# Patient Record
Sex: Male | Born: 1975 | Race: White | Hispanic: No | Marital: Married | Smoking: Never smoker
Health system: Southern US, Community
[De-identification: ages and names within clinical notes are randomized; demographics above are authoritative.]

## PROBLEM LIST (undated history)

## (undated) DIAGNOSIS — F329 Major depressive disorder, single episode, unspecified: Secondary | ICD-10-CM

## (undated) DIAGNOSIS — F32A Depression, unspecified: Secondary | ICD-10-CM

## (undated) DIAGNOSIS — E119 Type 2 diabetes mellitus without complications: Secondary | ICD-10-CM

## (undated) DIAGNOSIS — U070 Vaping-related disorder: Secondary | ICD-10-CM

## (undated) DIAGNOSIS — F419 Anxiety disorder, unspecified: Secondary | ICD-10-CM

## (undated) HISTORY — DX: Anxiety disorder, unspecified: F41.9

## (undated) HISTORY — PX: OTHER SURGICAL HISTORY: SHX169

## (undated) HISTORY — DX: Major depressive disorder, single episode, unspecified: F32.9

## (undated) HISTORY — DX: Depression, unspecified: F32.A

---

## 1998-06-15 ENCOUNTER — Emergency Department (HOSPITAL_COMMUNITY): Admission: EM | Admit: 1998-06-15 | Discharge: 1998-06-15 | Payer: Self-pay | Admitting: Emergency Medicine

## 2005-08-11 ENCOUNTER — Emergency Department (HOSPITAL_COMMUNITY): Admission: EM | Admit: 2005-08-11 | Discharge: 2005-08-11 | Payer: Self-pay | Admitting: Emergency Medicine

## 2005-08-22 ENCOUNTER — Emergency Department (HOSPITAL_COMMUNITY): Admission: EM | Admit: 2005-08-22 | Discharge: 2005-08-23 | Payer: Self-pay | Admitting: Emergency Medicine

## 2008-12-08 ENCOUNTER — Emergency Department (HOSPITAL_COMMUNITY): Admission: EM | Admit: 2008-12-08 | Discharge: 2008-12-08 | Payer: Self-pay | Admitting: Emergency Medicine

## 2010-06-08 ENCOUNTER — Emergency Department (HOSPITAL_COMMUNITY): Admission: EM | Admit: 2010-06-08 | Discharge: 2010-06-08 | Payer: Self-pay | Admitting: Family Medicine

## 2010-10-25 ENCOUNTER — Emergency Department (HOSPITAL_COMMUNITY): Admission: EM | Admit: 2010-10-25 | Discharge: 2010-10-25 | Payer: Self-pay | Admitting: Emergency Medicine

## 2011-08-24 ENCOUNTER — Emergency Department (HOSPITAL_COMMUNITY)
Admission: EM | Admit: 2011-08-24 | Discharge: 2011-08-24 | Disposition: A | Discharge status: Home / Self Care | Payer: BC Managed Care – PPO | Attending: Emergency Medicine | Admitting: Emergency Medicine

## 2011-08-24 DIAGNOSIS — R21 Rash and other nonspecific skin eruption: Secondary | ICD-10-CM | POA: Insufficient documentation

## 2011-08-24 DIAGNOSIS — L259 Unspecified contact dermatitis, unspecified cause: Secondary | ICD-10-CM | POA: Insufficient documentation

## 2011-08-24 DIAGNOSIS — L2989 Other pruritus: Secondary | ICD-10-CM | POA: Insufficient documentation

## 2011-08-24 DIAGNOSIS — L298 Other pruritus: Secondary | ICD-10-CM | POA: Insufficient documentation

## 2012-08-14 ENCOUNTER — Ambulatory Visit (INDEPENDENT_AMBULATORY_CARE_PROVIDER_SITE_OTHER): Payer: BC Managed Care – PPO | Admitting: Family Medicine

## 2012-08-14 VITALS — BP 129/72 | HR 81 | Temp 98.0°F | Resp 18 | Ht 71.0 in | Wt 267.0 lb

## 2012-08-14 DIAGNOSIS — A630 Anogenital (venereal) warts: Secondary | ICD-10-CM

## 2012-08-14 DIAGNOSIS — L738 Other specified follicular disorders: Secondary | ICD-10-CM

## 2012-08-14 DIAGNOSIS — L739 Follicular disorder, unspecified: Secondary | ICD-10-CM

## 2012-08-14 MED ORDER — IMIQUIMOD 5 % EX CREA
TOPICAL_CREAM | CUTANEOUS | Status: AC
Start: 2012-08-14 — End: 2013-08-14

## 2012-08-14 MED ORDER — DOXYCYCLINE HYCLATE 100 MG PO TABS: 100.0000 mg | ORAL_TABLET | Freq: Two times a day (BID) | ORAL | Status: AC

## 2012-08-14 NOTE — Patient Instructions (Addendum)
Look up PERIANAL WARTS at Web MD  Return in 3 months or sooner if needed

## 2012-08-14 NOTE — Progress Notes (Signed)
Subjective: Patient is here for me to check some lesions around his rectum that been irritating him. He itches a lot up into the butt also. 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 He knows of no STD exposure. However his ex-wife was caught with someone else, which is why he is divorced. It is along in that phase of life when he started getting the lesions. He has had one partner since then, but it hurt he had warts before then.  Objective: Folliculitis diffusely across his buttocks. Extensive perianal condyloma acuminata around the anus, and along the crease in the base of the scrotum. It looks like he has some roughness of the skin along the curvature of the gland that is very suspicious for wart tissue also.  Assessment: Lumbar acuminata Folliculitis  Plan: Discuss treatment options Aldara 3 times weekly for maximum of 13 weeks. He is to return in 3 months if he is still having problems. He is to come in sooner if he has problems. Doxycycline twice a day Patient was very discouraged by all of this. We are long discussion.

## 2013-03-19 ENCOUNTER — Ambulatory Visit (INDEPENDENT_AMBULATORY_CARE_PROVIDER_SITE_OTHER): Payer: BC Managed Care – PPO | Admitting: Family Medicine

## 2013-03-19 VITALS — BP 119/76 | HR 87 | Temp 98.1°F | Resp 17 | Ht 71.0 in | Wt 263.0 lb

## 2013-03-19 DIAGNOSIS — F329 Major depressive disorder, single episode, unspecified: Secondary | ICD-10-CM

## 2013-03-19 DIAGNOSIS — F341 Dysthymic disorder: Secondary | ICD-10-CM

## 2013-03-19 DIAGNOSIS — F419 Anxiety disorder, unspecified: Secondary | ICD-10-CM

## 2013-03-19 DIAGNOSIS — F32A Depression, unspecified: Secondary | ICD-10-CM

## 2013-03-19 MED ORDER — FLUOXETINE HCL 20 MG PO TABS: 20.0000 mg | ORAL_TABLET | Freq: Every day | ORAL | Status: DC

## 2013-03-19 NOTE — Patient Instructions (Addendum)
Take your medication one pill daily about the same time before resting.  Return in one month for reassessment  If ever any suicidal thoughts come in promptly

## 2013-03-19 NOTE — Progress Notes (Signed)
Subjective: Patient has been having problems with depression. He realizes he said this for some time. He's been separated for about 5 years. He says the depression really got worse and worse when he found out that his daughter was not disorder. He is being kept apart from  her. He denies any suicidal thoughts. 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 He says he thinks about things but does not dwell on them. He reports being very conscientious and careful driver. Denies any suicidal thoughts. He is concerned about his weight gain. He would like to date, but his anxieties keep him from that. He has been a little bit, but has not been sexually involved for many years. He had no extramarital relations while he was married, says he took his vows very seriously.  He would like to lose weight. We had long discussion about that.   Objective: Overweight white male in no acute distress. Although down in his countenance he is not despondent. He resides with his grandma when he is home from driving. 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 Very little other close relationships in his life. Just a couple of friends. His crying mostly took place when he was going on his daughter, but he is mostly got over that.  Assessment:  Anxiety and depression Obesity  Plan: Fluoxetine 20 mg one daily  Return in one month, sooner if problems Work hard on eating less and getting more regular exercise

## 2013-04-17 ENCOUNTER — Ambulatory Visit (INDEPENDENT_AMBULATORY_CARE_PROVIDER_SITE_OTHER): Payer: BC Managed Care – PPO | Admitting: Emergency Medicine

## 2013-04-17 VITALS — BP 142/82 | HR 62 | Temp 97.9°F | Resp 18 | Ht 73.0 in | Wt 261.0 lb

## 2013-04-17 DIAGNOSIS — F32A Depression, unspecified: Secondary | ICD-10-CM

## 2013-04-17 DIAGNOSIS — F341 Dysthymic disorder: Secondary | ICD-10-CM

## 2013-04-17 DIAGNOSIS — F329 Major depressive disorder, single episode, unspecified: Secondary | ICD-10-CM

## 2013-04-17 MED ORDER — FLUOXETINE HCL 20 MG PO TABS: 20.0000 mg | ORAL_TABLET | Freq: Every day | ORAL | Status: DC

## 2013-04-17 NOTE — Patient Instructions (Signed)

## 2013-04-17 NOTE — Progress Notes (Signed)
[facility] [address] [place] [address] [phone]  Date:  04/17/2013   Name:  Ricky Whitney   DOB:  April 04, 1976   MRN:  [id number]  PCP:  No primary provider on file.    Chief Complaint: Medication Refill   History of Present Illness:  Ricky Whitney is a 37 y.o. very pleasant male patient who presents with the following:  Seen by Dr Alwyn Ren for depression and has been on fluoxetine with marked improvement in overall status.  Work, personal relationships, appetite, and sleep are all improved.  Wants to stay on the medication as it is effective.  Denies other complaint or health concern today.   There is no problem list on file for this patient.   Past Medical History  Diagnosis Date  . Depression   . Anxiety     History reviewed. No pertinent past surgical history.  History  Substance Use Topics  . Smoking status: Current Every Day Smoker -- 2.00 packs/day    Types: Cigarettes  . Smokeless tobacco: Not on file  . Alcohol Use: No    History reviewed. No pertinent family history.  No Known Allergies  Medication list has been reviewed and updated.  Current Outpatient Prescriptions on File Prior to Visit  Medication Sig Dispense Refill  . FLUoxetine (PROZAC) 20 MG tablet Take 1 tablet (20 mg total) by mouth daily.  30 tablet  2  . imiquimod (ALDARA) 5 % cream Apply topically 3 (three) times a week. Use a small amount of medicine on affected areas 3 times a week  24 each  1   No current facility-administered medications on file prior to visit.    Review of Systems:  As per HPI, otherwise negative.    Physical Examination: Filed Vitals:   04/17/13 0922  BP: 142/82  Pulse: 62  Temp: 97.9 F (36.6 C)  Resp: 18   Filed Vitals:   04/17/13 0922  Height: 6\' 1"  (1.854 m)  Weight: 261 lb (118.389 kg)   Body mass index is 34.44 kg/(m^2). Ideal Body Weight: Weight in (lb) to have BMI = 25: 189.1  GEN: WDWN, NAD, Non-toxic, A & O x  3 HEENT: Atraumatic, Normocephalic. Neck supple. No masses, No LAD. Ears and Nose: No external deformity. CV: RRR, No M/G/R. No JVD. No thrill. No extra heart sounds. PULM: CTA B, no wheezes, crackles, rhonchi. No retractions. No resp. distress. No accessory muscle use. ABD: S, NT, ND, +BS. No rebound. No HSM. EXTR: No c/c/e NEURO Normal gait.  PSYCH: Normally interactive. Conversant. Not depressed or anxious appearing.  Calm demeanor.    Assessment and Plan: Depression under treatment Continue medications followup in 3 months   Signed,  Phillips Odor, MD

## 2013-07-17 ENCOUNTER — Ambulatory Visit: Payer: Self-pay | Admitting: Emergency Medicine

## 2013-07-17 VITALS — BP 120/78 | HR 65 | Temp 98.0°F | Resp 16 | Ht 71.5 in | Wt 247.0 lb

## 2013-07-17 DIAGNOSIS — Z0289 Encounter for other administrative examinations: Secondary | ICD-10-CM

## 2013-07-17 DIAGNOSIS — F32A Depression, unspecified: Secondary | ICD-10-CM | POA: Insufficient documentation

## 2013-07-17 DIAGNOSIS — F329 Major depressive disorder, single episode, unspecified: Secondary | ICD-10-CM | POA: Insufficient documentation

## 2013-07-17 NOTE — Progress Notes (Signed)
  Subjective:    Patient ID: Ricky Whitney, male    DOB: April 19, 1976, 37 y.o.   MRN: [id number]  HPI patient here for a DOT exam. He has a history of depression on Prozac but otherwise is doing well. He has no complaints at this time.    Review of Systems     Objective:   Physical Exam HEENT exam is normal. Neck is supple chest is clear to both auscultation and percussion. Heart regular rate without murmurs rubs or gallops. Abdomen soft liver and spleen not enlarged there is no area of tenderness . GU is that of a normal male testicles descended no hernia felt .       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 He has no side effects from the Prozac. He is a smoker but his respiratory exam was normal. He is restricted to glasses.

## 2013-07-26 ENCOUNTER — Encounter: Payer: Self-pay | Admitting: Emergency Medicine

## 2013-10-20 ENCOUNTER — Other Ambulatory Visit: Payer: Self-pay | Admitting: Emergency Medicine

## 2013-12-24 ENCOUNTER — Ambulatory Visit (INDEPENDENT_AMBULATORY_CARE_PROVIDER_SITE_OTHER): Payer: 59 | Admitting: Family Medicine

## 2013-12-24 VITALS — BP 108/78 | HR 64 | Temp 97.8°F | Resp 16 | Ht 72.0 in | Wt 273.0 lb

## 2013-12-24 DIAGNOSIS — J09X2 Influenza due to identified novel influenza A virus with other respiratory manifestations: Secondary | ICD-10-CM

## 2013-12-24 DIAGNOSIS — R5381 Other malaise: Secondary | ICD-10-CM

## 2013-12-24 DIAGNOSIS — R5383 Other fatigue: Principal | ICD-10-CM

## 2013-12-24 LAB — POCT CBC
GRANULOCYTE PERCENT: 58 % (ref 37–80)
HCT, POC: 49.9 % (ref 43.5–53.7)
Hemoglobin: 16 g/dL (ref 14.1–18.1)
LYMPH, POC: 1.8 (ref 0.6–3.4)
MCH, POC: 31.6 pg — AB (ref 27–31.2)
MCHC: 32.1 g/dL (ref 31.8–35.4)
MCV: 98.4 fL — AB (ref 80–97)
MID (CBC): 0.5 (ref 0–0.9)
MPV: 9.1 fL (ref 0–99.8)
POC Granulocyte: 3.1 (ref 2–6.9)
POC LYMPH %: 32.5 % (ref 10–50)
POC MID %: 9.5 %M (ref 0–12)
Platelet Count, POC: 191 10*3/uL (ref 142–424)
RBC: 5.07 M/uL (ref 4.69–6.13)
RDW, POC: 13.2 %
WBC: 5.4 10*3/uL (ref 4.6–10.2)

## 2013-12-24 LAB — POCT INFLUENZA A/B
Influenza A, POC: POSITIVE
Influenza B, POC: NEGATIVE

## 2013-12-24 LAB — GLUCOSE, POCT (MANUAL RESULT ENTRY): POC Glucose: 82 mg/dl (ref 70–99)

## 2013-12-24 NOTE — Patient Instructions (Signed)
You do have the flu.  Rest and drink plenty of fluids.  Use OTC medications as needed.  If you are not feeling better in the next few days please let me know- Sooner if worse.

## 2013-12-24 NOTE — Progress Notes (Signed)
[facility] [address], [place] [id number] [phone]  Date:  12/24/2013   Name:  Roni Breadhomas L Worsley   DOB:  09/13/1976   MRN:  [id number]  PCP:  No primary provider on file.    Chief Complaint: Dizziness   History of Present Illness:  Roni Breadhomas L Lariviere is a 38 y.o. very pleasant male patient who presents with the following:  He is here today because "I don't feel good."  "I feel like my equilibrium is a little off."   He has not felt good for the last 4 days. His illness started after h was out in some very cold weather and felt achy.   He then drove for a few more hours and went to bed. He felt very tired the next day.  He does note a sore throat, dry cough, head congestion.    He is a smoker but has no been able to smoke for the last few days He has not noted a fever.  He does have body aches.    He has not passed out. He feels fine sitting but standing he feels dizzy. He is eating ok.  He is drinking plenty of fliuds No nausea, vomiting and diarrhea.    He does have plans to pull a tooth next week- it's a wisdom tooth.  He has NOT been told that it was infected.  The tooth has been hurting more lately. He wonders if he has a "bacterial infection" due to his tooth.   Patient Active Problem List   Diagnosis Date Noted  . Depression 07/17/2013    Past Medical History  Diagnosis Date  . Depression   . Anxiety     No past surgical history on file.  History  Substance Use Topics  . Smoking status: Current Every Day Smoker -- 2.00 packs/day    Types: Cigarettes  . Smokeless tobacco: Not on file  . Alcohol Use: No    No family history on file.  No Known Allergies  Medication list has been reviewed and updated.  Current Outpatient Prescriptions on File Prior to Visit  Medication Sig Dispense Refill  . FLUoxetine (PROZAC) 20 MG capsule TAKE 1 CAPSULE BY MOUTH DAILY  30 capsule  2   No current facility-administered medications on file prior to  visit.    Review of Systems:  As per HPI- otherwise negative.   Physical Examination: Filed Vitals:   12/24/13 1439  BP: 106/72  Pulse: 65  Temp: 97.8 F (36.6 C)  Resp: 16   Filed Vitals:   12/24/13 1439  Height: 6' (1.829 m)  Weight: 273 lb (123.832 kg)   Body mass index is 37.02 kg/(m^2). Ideal Body Weight: Weight in (lb) to have BMI = 25: 183.9  GEN: WDWN, NAD, Non-toxic, A & O x 3, looks well HEENT: Atraumatic, Normocephalic. Neck supple. No masses, No LAD.  Bilateral TM wnl, oropharynx normal.  PEERL,EOMI.   He is missing most of his teeth but he does not appear to have any dental infection Ears and Nose: No external deformity. CV: RRR, No M/G/R. No JVD. No thrill. No extra heart sounds. PULM: CTA B, no wheezes, crackles, rhonchi. No retractions. No resp. distress. No accessory muscle use. ABD: S, NT, ND, +BS. No rebound. No HSM. EXTR: No c/c/e NEURO Normal gait.  PSYCH: Normally interactive. Conversant. Not depressed or anxious appearing.  Calm demeanor.    Results for orders placed in visit on 12/24/13  POCT  CBC      Result Value Range   WBC 5.4  4.6 - 10.2 K/uL   Lymph, poc 1.8  0.6 - 3.4   POC LYMPH PERCENT 32.5  10 - 50 %L   MID (cbc) 0.5  0 - 0.9   POC MID % 9.5  0 - 12 %M   POC Granulocyte 3.1  2 - 6.9   Granulocyte percent 58.0  37 - 80 %G   RBC 5.07  4.69 - 6.13 M/uL   Hemoglobin 16.0  14.1 - 18.1 g/dL   HCT, POC 43.1  54.0 - 53.7 %   MCV 98.4 (*) 80 - 97 fL   MCH, POC 31.6 (*) 27 - 31.2 pg   MCHC 32.1  31.8 - 35.4 g/dL   RDW, POC 08.6     Platelet Count, POC 191  142 - 424 K/uL   MPV 9.1  0 - 99.8 fL  GLUCOSE, POCT (MANUAL RESULT ENTRY)      Result Value Range   POC Glucose 82  70 - 99 mg/dl  POCT INFLUENZA A/B      Result Value Range   Influenza A, POC Positive     Influenza B, POC Negative     Orthostatics ok.   Assessment and Plan: Other malaise and fatigue - Plan: POCT CBC, POCT glucose (manual entry), POCT Influenza  A/B  Influenza due to identified novel influenza A virus with other respiratory manifestations  Flu a- this is the likely cause of his sx.  Explained that abx will not treat flu and it is too late for tamiflu to offer significant benefit.  See patient instructions for more details.     Signed Abbe Amsterdam, MD

## 2014-01-12 ENCOUNTER — Other Ambulatory Visit: Payer: Self-pay | Admitting: Emergency Medicine

## 2014-02-10 ENCOUNTER — Other Ambulatory Visit: Payer: Self-pay | Admitting: Emergency Medicine

## 2014-04-06 ENCOUNTER — Other Ambulatory Visit: Payer: Self-pay | Admitting: Physician Assistant

## 2014-04-09 ENCOUNTER — Ambulatory Visit (INDEPENDENT_AMBULATORY_CARE_PROVIDER_SITE_OTHER): Payer: 59 | Admitting: Family Medicine

## 2014-04-09 VITALS — BP 118/72 | HR 75 | Temp 98.0°F | Resp 18 | Ht 71.5 in | Wt 282.1 lb

## 2014-04-09 DIAGNOSIS — F411 Generalized anxiety disorder: Secondary | ICD-10-CM

## 2014-04-09 MED ORDER — FLUOXETINE HCL 20 MG PO TABS: ORAL_TABLET | ORAL | Status: DC

## 2014-04-09 NOTE — Progress Notes (Signed)
°  This chart was scribed for Johnn HaiKurt Lauestein, MD by Joaquin MusicKristina Sanchez-Matthews, ED Scribe. This patient was seen in room Room/bed 3 and the patient's care was started at 1:58 PM. Subjective:    Patient ID: Ricky Whitney, male    DOB: 11/08/1976, 38 y.o.   MRN: [id number] Chief Complaint  Patient presents with   Medication Refill    Needs refill on Prozac   HPI Ricky Breadhomas L Foell is a 38 y.o. male who [facility]. He is needing his Dilocaine rx . Has been taking the medication for over a year and denies having any complications. He states his employer has noticed a difference. Pt states he feels more focused, does the necessary things to keep himself happy unlike before, he would keep others happy. He states he does have days he feels agitated but states this is not that frequent.   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  Review of Systems  All other systems reviewed and are negative.  Objective:   Physical Exam  Nursing note and vitals reviewed. Constitutional: He is oriented to person, place, and time. He appears well-developed and well-nourished. No distress.  HENT:  Head: Normocephalic and atraumatic.  Right Ear: External ear normal.  Left Ear: External ear normal.  Eyes: Conjunctivae are normal. Pupils are equal, round, and reactive to light.  Neck: Normal range of motion. Neck supple. No thyromegaly present.  Cardiovascular: Normal rate and regular rhythm.   No murmur heard. Pulmonary/Chest: Effort normal and breath sounds normal. He has no wheezes.  Abdominal: Bowel sounds are normal. He exhibits no mass. There is no rebound and no guarding.  Musculoskeletal: Normal range of motion. He exhibits no tenderness.  Neurological: He is alert and oriented to person, place, and time. He has normal reflexes.  Skin: Skin is warm and dry. He is not diaphoretic.  Psychiatric: He has a normal mood  and affect. His behavior is normal.   Triage Vitals:BP 118/72   Pulse 75   Temp(Src) 98 F (36.7 C) (Oral)   Resp 18   Ht 5' 11.5" (1.816 m)   Wt 282 lb 2 oz (127.971 kg)   BMI 38.80 kg/m2   SpO2 96% Assessment & Plan:   Meds ordered this encounter  Medications   FLUoxetine (PROZAC) 20 MG tablet    Sig: One daily, may repeat if necessary    Dispense:  100 tablet    Refill:  3   Anxiety state, unspecified - Plan: FLUoxetine (PROZAC) 20 MG tablet  Signed, Elvina SidleKurt Lauenstein, MD

## 2014-04-09 NOTE — Patient Instructions (Signed)
Social Anxiety Disorder Social anxiety disorder, previously called social phobia, is a mental disorder. People with social anxiety disorder frequently feel nervous, afraid, or embarrassed when around other people in social situations. They constantly worry that other people are judging or criticizing them for how they look, what they say, or how they act. They may worry that other people might reject them because of their appearance or behavior. Social anxiety disorder is more than just occasional shyness or self-consciousness. It can cause severe emotional distress. It can interfere with daily life activities. Social anxiety disorder also may lead to excessive alcohol or drug use and even suicide.  Social anxiety disorder is actually one of the most common mental disorders. It can develop at any time but usually starts in the teenage years. Women are more commonly affected than men. Social anxiety disorder is also more common in people who have family members with anxiety disorders. It also is more common in people who have physical deformities or conditions with characteristics that are obvious to others, such as stuttered speech or movement abnormalities (Parkinson disease).  SYMPTOMS  In addition to feeling anxious or fearful in social situations, people with social anxiety disorder frequently have physical symptoms. Examples include:  Red face (blushing).  Racing heart.  Sweating.  Shaky hands or voice.  Confusion.  Lightheadedness.  Upset stomach and diarrhea. DIAGNOSIS  Social anxiety disorder is diagnosed through an assessment by your caregiver. Your caregiver will ask you questions about your mood, thoughts, and reactions in social situations. Your caregiver may ask you about your medical history and use of alcohol or drugs, including prescription medications. Certain medical conditions and the use of certain substances, including caffeine, can cause symptoms similar to social anxiety  disorder. Your caregiver may refer you to a mental health specialist for further evaluation or treatment. The criteria for diagnosis of social anxiety disorder are:  Marked fear or anxiety in one or more social situations in which you may be closely watched or studied by others. Examples of such situations include:  Interacting socially (having a conversation with others, going to a party, or meeting strangers).  Being observed (eating or drinking in public or being called on in class).  Performing in front of others (giving a speech).  The social situations of concern almost always cause fear or anxiety, not just occasionally.  People with social anxiety disorder fear that they will be viewed negatively in a way that will be embarrassing, will lead to rejection, or will offend others. This fear is out of proportion to the actual threat posed by the social situation.  Often the triggering social situations are avoided, or they are endured with intense fear or anxiety. The fear, anxiety, or avoidance is persistent and lasts for 6 months or longer.  The anxiety causes difficulty functioning in at least some parts of your daily life. TREATMENT  Several types of treatment are available for social anxiety disorder. These treatments are often used in combination and include:   Talk therapy. Group talk therapy allows you to see that you are not alone with these problems. Individual talk therapy helps you address your specific anxiety issues with a caring professional. The most effective forms of talk therapy for social anxiety disorder are cognitive behavioral therapy and exposure therapy. Cognitive behavioral therapy helps you to identify and change negative thoughts and beliefs that are at the root of the disorder. Exposure therapy allows you to gradually face the situations that you fear most.  Relaxation   and coping techniques. These include deep breathing, self-talk, meditation, visual imagery,  and yoga. Relaxation techniques help to keep you calm in social situations.  Social skills training.Social skills can be learned on your own or with the help of a talk therapist. They can help you feel more confident and comfortable in social situations.  Medication. For anxiety limited to performance situations (performance anxiety), medication called beta blockers can help by reducing or preventing the physical symptoms of social anxiety disorder. For more persistent and generalized social anxiety, antidepressant medication may be prescribed to help control symptoms. In severe cases of social anxiety disorder, strong antianxiety medication, called benzodiazepines, may be prescribed on a limited basis and for a short time. Document Released: 11/06/2005 Document Revised: 04/04/2013 Document Reviewed: 03/08/2013 [employer] Patient Information 2014 [employer].  

## 2014-10-24 ENCOUNTER — Ambulatory Visit (INDEPENDENT_AMBULATORY_CARE_PROVIDER_SITE_OTHER): Payer: BC Managed Care – PPO | Admitting: Physician Assistant

## 2014-10-24 VITALS — BP 137/81 | HR 72 | Temp 97.5°F | Resp 18 | Ht 71.25 in | Wt 276.4 lb

## 2014-10-24 DIAGNOSIS — S91311A Laceration without foreign body, right foot, initial encounter: Secondary | ICD-10-CM

## 2014-10-24 DIAGNOSIS — Z23 Encounter for immunization: Secondary | ICD-10-CM

## 2014-10-24 DIAGNOSIS — Z4802 Encounter for removal of sutures: Secondary | ICD-10-CM

## 2014-10-24 DIAGNOSIS — S023XXD Fracture of orbital floor, subsequent encounter for fracture with routine healing: Secondary | ICD-10-CM

## 2014-10-24 MED ORDER — DOXYCYCLINE HYCLATE 100 MG PO CAPS: 100.0000 mg | ORAL_CAPSULE | Freq: Two times a day (BID) | ORAL | Status: AC

## 2014-10-24 NOTE — Patient Instructions (Signed)
Return in 10 days for suture removal. Will reassess your facial injuries at that time as well.  Laceration Care, Adult A laceration is a cut or lesion that goes through all layers of the skin and into the tissue just beneath the skin. TREATMENT  Some lacerations may not require closure. Some lacerations may not be able to be closed due to an increased risk of infection. It is important to see your caregiver as soon as possible after an injury to minimize the risk of infection and maximize the opportunity for successful closure. If closure is appropriate, pain medicines may be given, if needed. The wound will be cleaned to help prevent infection. Your caregiver will use stitches (sutures), staples, wound glue (adhesive), or skin adhesive strips to repair the laceration. These tools bring the skin edges together to allow for faster healing and a better cosmetic outcome. However, all wounds will heal with a scar. Once the wound has healed, scarring can be minimized by covering the wound with sunscreen during the day for 1 full year. HOME CARE INSTRUCTIONS  For sutures or staples:  Keep the wound clean and dry.  If you were given a bandage (dressing), you should change it at least once a day. Also, change the dressing if it becomes wet or dirty, or as directed by your caregiver.  Wash the wound with soap and water 2 times a day. Rinse the wound off with water to remove all soap. Pat the wound dry with a clean towel.  After cleaning, apply a thin layer of the antibiotic ointment as recommended by your caregiver. This will help prevent infection and keep the dressing from sticking.  You may shower as usual after the first 24 hours. Do not soak the wound in water until the sutures are removed.  Only take over-the-counter or prescription medicines for pain, discomfort, or fever as directed by your caregiver.  Get your sutures or staples removed as directed by your caregiver. For skin adhesive  strips:  Keep the wound clean and dry.  Do not get the skin adhesive strips wet. You may bathe carefully, using caution to keep the wound dry.  If the wound gets wet, pat it dry with a clean towel.  Skin adhesive strips will fall off on their own. You may trim the strips as the wound heals. Do not remove skin adhesive strips that are still stuck to the wound. They will fall off in time. For wound adhesive:  You may briefly wet your wound in the shower or bath. Do not soak or scrub the wound. Do not swim. Avoid periods of heavy perspiration until the skin adhesive has fallen off on its own. After showering or bathing, gently pat the wound dry with a clean towel.  Do not apply liquid medicine, cream medicine, or ointment medicine to your wound while the skin adhesive is in place. This may loosen the film before your wound is healed.  If a dressing is placed over the wound, be careful not to apply tape directly over the skin adhesive. This may cause the adhesive to be pulled off before the wound is healed.  Avoid prolonged exposure to sunlight or tanning lamps while the skin adhesive is in place. Exposure to ultraviolet light in the first year will darken the scar.  The skin adhesive will usually remain in place for 5 to 10 days, then naturally fall off the skin. Do not pick at the adhesive film. You may need a tetanus shot if:  You cannot remember when you had your last tetanus shot.  You have never had a tetanus shot. If you get a tetanus shot, your arm may swell, get red, and feel warm to the touch. This is common and not a problem. If you need a tetanus shot and you choose not to have one, there is a rare chance of getting tetanus. Sickness from tetanus can be serious. SEEK MEDICAL CARE IF:   You have redness, swelling, or increasing pain in the wound.  You see a red line that goes away from the wound.  You have yellowish-white fluid (pus) coming from the wound.  You have a  fever.  You notice a bad smell coming from the wound or dressing.  Your wound breaks open before or after sutures have been removed.  You notice something coming out of the wound such as wood or glass.  Your wound is on your hand or foot and you cannot move a finger or toe. SEEK IMMEDIATE MEDICAL CARE IF:   Your pain is not controlled with prescribed medicine.  You have severe swelling around the wound causing pain and numbness or a change in color in your arm, hand, leg, or foot.  Your wound splits open and starts bleeding.  You have worsening numbness, weakness, or loss of function of any joint around or beyond the wound.  You develop painful lumps near the wound or on the skin anywhere on your body. MAKE SURE YOU:   Understand these instructions.  Will watch your condition.  Will get help right away if you are not doing well or get worse. Document Released: 12/08/2005 Document Revised: 03/01/2012 Document Reviewed: 06/03/2011 [facility] Patient Information 2015 [employer] [place]. This information is not intended to replace advice given to you by your health care provider. Make sure you discuss any questions you have with your health care provider.
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
## 2014-10-27 NOTE — Progress Notes (Signed)
I was directly involved with the patient's care and agree with the physical, diagnosis and treatment plan and actively participated during the procedure.

## 2014-11-03 ENCOUNTER — Other Ambulatory Visit: Payer: Self-pay | Admitting: Family Medicine

## 2014-11-06 ENCOUNTER — Ambulatory Visit (INDEPENDENT_AMBULATORY_CARE_PROVIDER_SITE_OTHER): Payer: BC Managed Care – PPO | Admitting: Family Medicine

## 2014-11-06 VITALS — BP 156/80 | HR 70 | Temp 98.1°F | Resp 16

## 2014-11-06 DIAGNOSIS — Z4802 Encounter for removal of sutures: Secondary | ICD-10-CM

## 2014-11-06 NOTE — Progress Notes (Signed)
[facility] [address], [place] [address] [phone]  Date:  11/06/2014   Name:  Ricky Whitney   DOB:  11/04/1976   MRN:  [id number]  PCP:  No primary care provider on file.    Chief Complaint: Suture / Staple Removal   History of Present Illness:  Ricky Whitney is a 38 y.o. very pleasant male patient who presents with the following:  Needs to have suture removal.  He was here on 11/3 with a laceration of his right foot.  He had 3 sutures placed.  Needs these removed today.  He feels that he is doing well, he has just mild tenderness at the site of his laceration  Patient Active Problem List   Diagnosis Date Noted  . Depression 07/17/2013    Past Medical History  Diagnosis Date  . Depression   . Anxiety     No past surgical history on file.  History  Substance Use Topics  . Smoking status: Former Smoker -- 2.00 packs/day    Types: Cigarettes  . Smokeless tobacco: Former NeurosurgeonUser    Quit date: 02/09/2014  . Alcohol Use: 0.0 oz/week    0 Not specified per week     Comment: occassional    No family history on file.  No Known Allergies  Medication list has been reviewed and updated.  Current Outpatient Prescriptions on File Prior to Visit  Medication Sig Dispense Refill  . amoxicillin-clavulanate (AUGMENTIN) 875-125 MG per tablet Take 1 tablet by mouth 2 (two) times daily.    Marland Kitchen. FLUoxetine (PROZAC) 20 MG tablet Take 1 tablet daily, may repeat dose if necessary. PATIENT NEEDS OFFICE VISIT FOR ADDITIONAL REFILLS 60 tablet 0  . HYDROcodone-acetaminophen (NORCO) 7.5-325 MG per tablet Take 1 tablet by mouth every 6 (six) hours as needed for moderate pain.     No current facility-administered medications on file prior to visit.    Review of Systems:  As per HPI- otherwise negative.   Physical Examination: Filed Vitals:   11/06/14 1505  BP: 156/80  Pulse: 70  Temp: 98.1 F (36.7 C)  Resp: 16   There were no vitals filed for this  visit. There is no weight on file to calculate BMI. Ideal Body Weight:     GEN: WDWN, NAD, Non-toxic, Alert & Oriented x 3 HEENT: Atraumatic, Normocephalic.  Ears and Nose: No external deformity. EXTR: No clubbing/cyanosis/edema NEURO: Normal gait.  PSYCH: Normally interactive. Conversant. Not depressed or anxious appearing.  Calm demeanor.  Well healed wound on right foot, sole by the great toe.   Removed #3 sutures, wound is well closed Assessment and Plan: Visit for suture removal  Counseled to keep the wound soft with moisturizer or antibiotic ointment, keep covered so it does not snag.  Follow-up if needed  Signed Abbe AmsterdamJessica Katelind Pytel, MD

## 2014-12-06 ENCOUNTER — Other Ambulatory Visit: Payer: Self-pay | Admitting: Family Medicine

## 2014-12-25 ENCOUNTER — Other Ambulatory Visit: Payer: Self-pay | Admitting: Family Medicine

## 2015-02-02 ENCOUNTER — Other Ambulatory Visit: Payer: Self-pay | Admitting: Family Medicine

## 2015-05-20 ENCOUNTER — Ambulatory Visit (INDEPENDENT_AMBULATORY_CARE_PROVIDER_SITE_OTHER): Payer: BLUE CROSS/BLUE SHIELD | Admitting: Family Medicine

## 2015-05-20 VITALS — BP 110/74 | HR 67 | Temp 98.2°F | Resp 18 | Ht 71.25 in | Wt 274.0 lb

## 2015-05-20 DIAGNOSIS — F329 Major depressive disorder, single episode, unspecified: Secondary | ICD-10-CM

## 2015-05-20 DIAGNOSIS — F32A Depression, unspecified: Secondary | ICD-10-CM

## 2015-05-20 MED ORDER — FLUOXETINE HCL 40 MG PO CAPS: 40.0000 mg | ORAL_CAPSULE | Freq: Every day | ORAL | Status: DC

## 2015-05-20 NOTE — Patient Instructions (Signed)
Depression Depression refers to feeling sad, low, down in the dumps, blue, gloomy, or empty. In general, there are two kinds of depression:  Normal sadness or normal grief. This kind of depression is one that we all feel from time to time after upsetting life experiences, such as the loss of a job or the ending of a relationship. This kind of depression is considered normal, is short lived, and resolves within a few days to 2 weeks. Depression experienced after the loss of a loved one (bereavement) often lasts longer than 2 weeks but normally gets better with time.  Clinical depression. This kind of depression lasts longer than normal sadness or normal grief or interferes with your ability to function at home, at work, and in school. It also interferes with your personal relationships. It affects almost every aspect of your life. Clinical depression is an illness. Symptoms of depression can also be caused by conditions other than those mentioned above, such as:  Physical illness. Some physical illnesses, including underactive thyroid gland (hypothyroidism), severe anemia, specific types of cancer, diabetes, uncontrolled seizures, heart and lung problems, strokes, and chronic pain are commonly associated with symptoms of depression.  Side effects of some prescription medicine. In some people, certain types of medicine can cause symptoms of depression.  Substance abuse. Abuse of alcohol and illicit drugs can cause symptoms of depression. SYMPTOMS Symptoms of normal sadness and normal grief include the following:  Feeling sad or crying for short periods of time.  Not caring about anything (apathy).  Difficulty sleeping or sleeping too much.  No longer able to enjoy the things you used to enjoy.  Desire to be by oneself all the time (social isolation).  Lack of energy or motivation.  Difficulty concentrating or remembering.  Change in appetite or weight.  Restlessness or  agitation. Symptoms of clinical depression include the same symptoms of normal sadness or normal grief and also the following symptoms:  Feeling sad or crying all the time.  Feelings of guilt or worthlessness.  Feelings of hopelessness or helplessness.  Thoughts of suicide or the desire to harm yourself (suicidal ideation).  Loss of touch with reality (psychotic symptoms). Seeing or hearing things that are not real (hallucinations) or having false beliefs about your life or the people around you (delusions and paranoia). DIAGNOSIS  The diagnosis of clinical depression is usually based on how bad the symptoms are and how long they have lasted. Your health care provider will also ask you questions about your medical history and substance use to find out if physical illness, use of prescription medicine, or substance abuse is causing your depression. Your health care provider may also order blood tests. TREATMENT  Often, normal sadness and normal grief do not require treatment. However, sometimes antidepressant medicine is given for bereavement to ease the depressive symptoms until they resolve. The treatment for clinical depression depends on how bad the symptoms are but often includes antidepressant medicine, counseling with a mental health professional, or both. Your health care provider will help to determine what treatment is best for you. Depression caused by physical illness usually goes away with appropriate medical treatment of the illness. If prescription medicine is causing depression, talk with your health care provider about stopping the medicine, decreasing the dose, or changing to another medicine. Depression caused by the abuse of alcohol or illicit drugs goes away when you stop using these substances. Some adults need professional help in order to stop drinking or using drugs. [place]  CARE IF:  You have thoughts about hurting yourself or others.  You lose touch  with reality (have psychotic symptoms).  You are taking medicine for depression and have a serious side effect. FOR MORE INFORMATION  National Alliance on Mental Illness: www.nami.[employer]  National Institute of Mental Health: http://www.maynard.net/www.nimh.nih.gov Document Released: 12/05/2000 Document Revised: 04/24/2014 Document Reviewed: 03/08/2012 [facility] Patient Information 2015 [place]. This information is not intended to replace advice given to you by your health care provider. Make sure you discuss any questions you have with your health care provider.  Binge Eating Disorder Binge eating is uncontrolled eating of large amounts of food. Binge eating occurs in a discrete period of time and may occur to the point of feeling physically uncomfortable. Binge eating that occurs regularly for 6 months or longer is considered binge eating disorder. People with binge eating disorder do not purge after binge eating.  RISK FACTORS More women than men have binge eating disorder. It usually starts during the teenage years or early 5120s. There is not one cause of binge eating disorder. A number of risk factors have been identified:  Family history of eating disorders. There may be a genetic component to eating disorders. Eating behaviors and learning how to cope with distress may also be learned.  Low self-esteem.  Overweight at a young age.  Frequent, unsuccessful dieting.  Depression or anxiety.  Difficulty coping with emotions or distress.  History of alcohol or drug abuse. SYMPTOMS Symptoms of binge eating disorder include:  Frequently eating large amounts of food very fast.  Frequently eating a lot more than necessary to feel full.  Feeling a loss of control when eating, such as not being able to stop eating.  Feeling bad about overeating. DIAGNOSIS A diagnosis of binge eating disorder is based on established criteria for the disorder. These criteria are listed as follows:  Binge eating 2  times per week or more for 6 months or longer.  Three or more of the following behaviors:  Eating very fast.  Eating until feeling uncomfortable.  Eating a lot of food when not hungry.  Eating alone to hide eating.  Feeling disgusted, depressed, or ashamed about eating.  Fear that binge eating cannot be stopped. TREATMENT Caregivers who usually treat people with binge eating disorders are mental health professionals, such as psychologists, psychiatrists, and clinical social workers. More than one type of treatment often is used. Treatments may include:  Psychotherapy.  Cognitive behavioral therapy. This helps you recognize your thoughts, beliefs, and emotions that contribute to unhealthy eating habits. Then it helps you change that habit.  Interpersonal psychotherapy. This shows you how to have better relationships with others. You might learn new ways to communicate.  Dialectical behavioral therapy. This type of treatment helps you learn skills to regulate your emotions and tolerate distress without using binge eating.  Antidepressant medications. These medications affect the levels of certain chemicals in the brain. One type is called selective serotonin reuptake inhibitor. It often helps people with binge eating disorder.  Weight-loss programs. These can be important for binge eaters who weigh too much. Losing excess weight can help prevent other health problems, such as diabetes and heart disease. It can also help you feel better about yourself. SEEK IMMEDIATE MEDICAL CARE IF:   You have frequent nausea or you vomit often.  You have chest pain.  You have trouble breathing.  You think about harming yourself or someone else.  You start vomiting on purpose or using other behaviors (  excessive exercise, laxatives) to compensate for binges. FOR MORE INFORMATION: National Eating Disorders Association: www.nationaleatingdisorders.org Document Released: 04/30/2011 Document  Revised: 03/01/2012 Document Reviewed: 04/30/2011 [facility] Patient Information 2015 [place], [place]. This information is not intended to replace advice given to you by your health care provider. Make sure you discuss any questions you have with your health care provider.

## 2015-05-20 NOTE — Progress Notes (Signed)
Subjective:  This chart was scribed for Norberto SorensonEva Shaw, MD, by Elon SpannerGarrett Cook, ED Scribe. This patient was seen in room 4 and the patient's care was started at 4:05 PM.   Patient ID: Ricky Whitney, male    DOB: 08/23/1976, 39 y.o.   MRN: [id number]  HPI  HPI Comments: Ricky Whitney is a 39 y.o. male who [facility] medication refill of his Prozac with an increase in dosage.  Patient reports he has been on 20 mg Prozac for 2 years with significant relief of his depression at first but that the benefits of the medication have been decreasing recently.  He reports his depression began in 2009 after a divorce and persisted until he began taking Prozac 2 years ago.  He does not receive counseling.  He denies problems with his work or home life.  He denies prolonged periods without sleep.  Patient denies history of reckless, impulsive behaviors including.  Patient denies excessive shopping.       Patient was prescribed Ritalin as a child, which he reports made him somnolent.  Patient has not ever prescribed any other psychoactive medications.    6-7 months ago the patient notes the onset of episodes of binge eating to the point he feels "sick:" "keep me away from a buffet."  He did not have these episodes at a previous point in his life.    Patient inquires about sleep apnea.  He has never been told that he snores loudly and wakes refreshed after a full nights sleep.   The patient reports his father has a history of bipolar disorder with multiple personalities.    Past Medical History  Diagnosis Date  . Depression   . Anxiety    Current Outpatient Prescriptions on File Prior to Visit  Medication Sig Dispense Refill  . HYDROcodone-acetaminophen (NORCO) 7.5-325 MG per tablet Take 1 tablet by mouth every 6 (six) hours as needed for moderate pain.     No current facility-administered medications on file prior to visit.   No Known Allergies  Review of Systems  Constitutional: Positive  for appetite change. Negative for activity change and unexpected weight change.  Eyes: Negative for visual disturbance.  Respiratory: Negative for chest tightness.   Cardiovascular: Negative for chest pain.  Gastrointestinal: Negative for nausea, vomiting and abdominal pain.  Musculoskeletal: Negative for joint swelling and gait problem.  Neurological: Negative for dizziness, weakness and light-headedness.  Psychiatric/Behavioral: Positive for dysphoric mood and decreased concentration. Negative for suicidal ideas, confusion, sleep disturbance and agitation. The patient is not nervous/anxious.        Objective:  BP 110/74 mmHg  Pulse 67  Temp(Src) 98.2 F (36.8 C) (Oral)  Resp 18  Ht 5' 11.25" (1.81 m)  Wt 274 lb (124.286 kg)  BMI 37.94 kg/m2  SpO2 98%  Physical Exam  Constitutional: He is oriented to person, place, and time. He appears well-developed and well-nourished. No distress.  HENT:  Head: Normocephalic and atraumatic.  Eyes: Conjunctivae and EOM are normal.  Neck: Neck supple. No tracheal deviation present.  Cardiovascular: Normal rate.   Pulmonary/Chest: Effort normal. No respiratory distress.  Musculoskeletal: Normal range of motion.  Neurological: He is alert and oriented to person, place, and time.  Skin: Skin is warm and dry.  Psychiatric: He has a normal mood and affect. His behavior is normal.  Nursing note and vitals reviewed.        Assessment & Plan:  4:19 PM Will increase medication  to 40 mg.  Patient likes the tablet form which is available in 20 and 60 mg only, but he is willing to try the capsule.  Patient was told that if his mood doesn't improve then to f/u and his dose may be increased to 60 mg.  Patient also inquired about sleep apnea Depression  Meds ordered this encounter  Medications  . FLUoxetine (PROZAC) 40 MG capsule    Sig: Take 1 capsule (40 mg total) by mouth daily.    Dispense:  90 capsule    Refill:  3    I personally performed  the services described in this documentation, which was scribed in my presence. The recorded information has been reviewed and considered, and addended by me as needed.  Norberto Sorenson, MD MPH

## 2015-05-24 NOTE — Progress Notes (Signed)
lmom to call an make an appt for a CPE with one of our providers

## 2015-05-27 ENCOUNTER — Telehealth: Payer: Self-pay

## 2015-05-27 NOTE — Telephone Encounter (Signed)
Please advise on refill.

## 2015-05-27 NOTE — Telephone Encounter (Signed)
Patient is a Naval architecttruck driver.  His truck broke down in [place], his medication is in the truck.  [place]  Please call in FLUoxetine (PROZAC) 40 MG capsule To [facility] [address]    [phone]

## 2015-05-28 NOTE — Telephone Encounter (Signed)
Called pt to advise that he has RFs at pharm but pt reported that he had not left in truck after all, he found his medication.

## 2015-07-08 ENCOUNTER — Ambulatory Visit (INDEPENDENT_AMBULATORY_CARE_PROVIDER_SITE_OTHER): Payer: Self-pay | Admitting: Family Medicine

## 2015-07-08 VITALS — BP 124/80 | HR 60 | Temp 98.2°F | Resp 20 | Ht 71.5 in | Wt 278.2 lb

## 2015-07-08 DIAGNOSIS — Z0000 Encounter for general adult medical examination without abnormal findings: Secondary | ICD-10-CM

## 2015-07-08 NOTE — Progress Notes (Signed)
Patient ID: Ricky Whitney, male   DOB: 1976-07-11, 39 y.o.   MRN: [id number]    This chart was scribed for Elvina Sidle, MD by [facility], medical scribe at [facility].The patient was seen in exam room 03 and the patient's care was started at 2:29 PM.  Patient ID: Ricky Whitney MRN: [id number], DOB: August 22, 1976, 39 y.o. Date of Encounter: 07/08/2015  Primary Physician: No primary care provider on file.  Chief Complaint:  Chief Complaint  Patient presents with   Employment Physical    DOT   HPI:  Ricky Whitney is a 39 y.o. male who presents to [facility] for a DOT physical exam. He currently drives for [employer].  Past Medical History  Diagnosis Date   Depression    Anxiety     Home Meds: Prior to Admission medications   Medication Sig Start Date End Date Taking? Authorizing Provider  FLUoxetine (PROZAC) 40 MG capsule Take 1 capsule (40 mg total) by mouth daily. 05/20/15  Yes Sherren Mocha, MD   Allergies: No Known Allergies  History   Social History   Marital Status: Married    Spouse Name: N/A   Number of Children: N/A   Years of Education: N/A   Occupational History   Not on file.   Social History Main Topics   Smoking status: Former Smoker -- 2.00 packs/day    Types: Cigarettes   Smokeless tobacco: Former Neurosurgeon    Quit date: 02/09/2014   Alcohol Use: 0.0 oz/week    0 Standard drinks or equivalent per week     Comment: occassional   Drug Use: Not on file   Sexual Activity: Not Currently   Other Topics Concern   Not on file   Social History Narrative    Review of Systems: Constitutional: negative for chills, fever, night sweats, weight changes, or fatigue  HEENT: negative for vision changes, hearing loss, congestion, rhinorrhea, ST, epistaxis, or sinus pressure Cardiovascular: negative for chest pain or palpitations Respiratory: negative for hemoptysis, wheezing, shortness of breath, or  cough Abdominal: negative for abdominal pain, nausea, vomiting, diarrhea, or constipation Dermatological: negative for rash Neurologic: negative for headache, dizziness, or syncope All other systems reviewed and are otherwise negative with the exception to those above and in the HPI.  Physical Exam: Blood pressure 124/80, pulse 60, temperature 98.2 F (36.8 C), temperature source Oral, resp. rate 20, height 5' 11.5" (1.816 m), weight 278 lb 3.2 oz (126.191 kg), SpO2 97 %., Body mass index is 38.26 kg/(m^2). General: Well developed, well nourished, in no acute distress. Head: Normocephalic, atraumatic, eyes without discharge, sclera non-icteric, nares are without discharge. Bilateral auditory canals clear, TM's are without perforation, pearly grey and translucent with reflective cone of light bilaterally. Oral cavity moist, posterior pharynx without exudate, erythema, peritonsillar abscess, or post nasal drip.  Neck: Supple. No thyromegaly. Full ROM. No lymphadenopathy. Lungs: Clear bilaterally to auscultation without wheezes, rales, or rhonchi. Breathing is unlabored. Heart: RRR with S1 S2. No murmurs, rubs, or gallops appreciated. Abdomen: Soft, non-tender, non-distended with normoactive bowel sounds. No hepatomegaly. No rebound/guarding. No obvious abdominal masses. Msk:  Strength and tone normal for age. Extremities/Skin: Warm and dry. No clubbing or cyanosis. No edema. No rashes or suspicious lesions. Neuro: Alert and oriented X 3. Moves all extremities spontaneously. Gait is normal. CNII-XII grossly in tact. Psych:  Responds to questions appropriately with a normal affect.    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    ICD-9-CM ICD-10-CM   1. Annual physical exam V70.0 Z00.00      Signed, Elvina SidleKurt Lauenstein, MD  Signed, Elvina SidleKurt Lauenstein,  MD 07/08/2015 2:29 PM

## 2016-05-06 ENCOUNTER — Other Ambulatory Visit: Payer: Self-pay | Admitting: Family Medicine

## 2016-05-09 NOTE — Telephone Encounter (Signed)
Needs OV for any additional refills 

## 2016-05-13 NOTE — Telephone Encounter (Signed)
Notified pt need for f/up on vm.

## 2016-09-13 ENCOUNTER — Telehealth: Payer: Self-pay

## 2016-09-13 NOTE — Telephone Encounter (Signed)
Pt has an appointment for med refills next Friday.  Is there any way we can fill this for one week until he gets in?  He was going to be seen today 09-13-16, but did not know we were by appointment only.  We had none left.  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

## 2016-09-15 ENCOUNTER — Other Ambulatory Visit: Payer: Self-pay

## 2016-09-15 MED ORDER — FLUOXETINE HCL 40 MG PO CAPS: 40.0000 mg | ORAL_CAPSULE | Freq: Every day | ORAL | 0 refills | Status: DC

## 2016-09-15 NOTE — Telephone Encounter (Signed)
Message left ir pt regarding his medication.

## 2016-09-19 ENCOUNTER — Ambulatory Visit: Payer: BLUE CROSS/BLUE SHIELD

## 2016-09-27 ENCOUNTER — Ambulatory Visit (INDEPENDENT_AMBULATORY_CARE_PROVIDER_SITE_OTHER): Payer: BLUE CROSS/BLUE SHIELD | Admitting: Family Medicine

## 2016-09-27 VITALS — BP 118/82 | HR 68 | Temp 98.0°F | Resp 18 | Ht 71.5 in | Wt 290.0 lb

## 2016-09-27 DIAGNOSIS — F3341 Major depressive disorder, recurrent, in partial remission: Secondary | ICD-10-CM | POA: Diagnosis not present

## 2016-09-27 MED ORDER — FLUOXETINE HCL 60 MG PO TABS: 60.0000 mg | ORAL_TABLET | Freq: Every day | ORAL | 3 refills | Status: DC

## 2016-09-27 NOTE — Patient Instructions (Addendum)
     IF you received an x-ray today, you will receive an invoice from [facility]. Please contact [facility] at [phone] with questions or concerns regarding your invoice.   IF you received labwork today, you will receive an invoice from [facility]. Please contact [facility] at [phone] with questions or concerns regarding your invoice.   Our billing staff will not be able to assist you with questions regarding bills from these companies.  You will be contacted with the lab results as soon as they are available. The fastest way to get your results is to activate your My Chart account. Instructions are located on the last page of this paperwork. If you have not heard from us regarding the results in 2 weeks, please contact this office.     You can call the 24-hour [facility] HelpLine at [phone] or [phone] for immediate assistance. Among several different types of services, they offer an Intensive oupatient program for mood disorders - which is a group type setting Monday-Friday 9-noon.  You can schedule an assessment by calling the above numbers during which the costs for the program and insurance benefits will be reviewed.    No psychological or psychiatric services take physician referrals - they always want the patient to call. Some excellent private psychiatrists for individual counseling are:  [facility] [address]  [place], [place] [address] Phone: [phone]  [facility] [address], [place], [place] [address]  Phone: [phone]   [facility] [address], [place], [place] [address]  [phone]  [facility] [address] [place], [place] Phone: [phone]   Charlies SilversParish A. McKinney, MD, PA [address], [address] [place], [place] [address] Phone:  [phone]
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
## 2017-01-06 ENCOUNTER — Other Ambulatory Visit: Payer: Self-pay

## 2017-01-06 MED ORDER — FLUOXETINE HCL 60 MG PO TABS: 60.0000 mg | ORAL_TABLET | Freq: Every day | ORAL | 0 refills | Status: DC

## 2017-01-06 NOTE — Telephone Encounter (Signed)
Fluoxetine 20 mg three daily  Vs fluoxetine 60mg  one per day  Cost savings 162.06

## 2017-10-09 ENCOUNTER — Other Ambulatory Visit: Payer: Self-pay | Admitting: Family Medicine

## 2017-10-13 NOTE — Telephone Encounter (Signed)
Pt is long-overdue for a f/u OV. Please have him sched an appt within the next month so we can continue to make sure the medicines are keeping him safe before we refill them further. Thanks.

## 2017-12-05 ENCOUNTER — Other Ambulatory Visit: Payer: Self-pay

## 2017-12-05 ENCOUNTER — Ambulatory Visit: Payer: BLUE CROSS/BLUE SHIELD | Admitting: Family Medicine

## 2017-12-05 ENCOUNTER — Encounter: Payer: Self-pay | Admitting: Family Medicine

## 2017-12-05 VITALS — BP 118/82 | HR 75 | Temp 97.7°F | Resp 18 | Ht 71.5 in | Wt 305.4 lb

## 2017-12-05 DIAGNOSIS — F3341 Major depressive disorder, recurrent, in partial remission: Secondary | ICD-10-CM

## 2017-12-05 DIAGNOSIS — Z6841 Body Mass Index (BMI) 40.0 and over, adult: Secondary | ICD-10-CM | POA: Diagnosis not present

## 2017-12-05 MED ORDER — FLUOXETINE HCL 20 MG PO TABS: 80.0000 mg | ORAL_TABLET | Freq: Every day | ORAL | 11 refills | Status: DC

## 2017-12-05 NOTE — Patient Instructions (Addendum)
IF you received an x-ray today, you will receive an invoice from [facility]. Please contact [facility] at [phone] with questions or concerns regarding your invoice.   IF you received labwork today, you will receive an invoice from [facility]. Please contact [facility] at [phone] with questions or concerns regarding your invoice.   Our billing staff will not be able to assist you with questions regarding bills from these companies.  You will be contacted with the lab results as soon as they are available. The fastest way to get your results is to activate your My Chart account. Instructions are located on the last page of this paperwork. If you have not heard from us regarding the results in 2 weeks, please contact this office.     Persistent Depressive Disorder Persistent depressive disorder (PDD) is a mental health condition. PDD causes symptoms of low-level depression for 2 years or longer. It may also be called long-term (chronic) depression or dysthymia. PDD may include episodes of more severe depression that last for about 2 weeks (major depressive disorder or MDD). PDD can affect the way you think, feel, and sleep. This condition may also affect your relationships. You may be more likely to get sick if you have PDD. Symptoms of PDD occur for most of the day and may include:  Feeling tired (fatigue).  Low energy.  Eating too much or too little.  Sleeping too much or too little.  Feeling restless or agitated.  Feeling hopeless.  Feeling worthless or guilty.  Feeling worried or nervous (anxiety).  Trouble concentrating or making decisions.  Low self-esteem.  A negative way of looking at things (outlook).  Not being able to have fun or feel pleasure.  Avoiding interacting with people.  Getting angry or annoyed easily (irritability).  Acting aggressive or angry.  Follow these instructions at home: Activity  Go back to your normal  activities as told by your doctor.  Exercise regularly as told by your doctor. General instructions  Take over-the-counter and prescription medicines only as told by your doctor.  Do not drink alcohol. Or, limit how much alcohol you drink to no more than 1 drink a day for nonpregnant women and 2 drinks a day for men. One drink equals 12 oz of beer, 5 oz of wine, or 1 oz of hard liquor. Alcohol can affect any antidepressant medicines you are taking. Talk with your doctor about your alcohol use.  Eat a healthy diet and get plenty of sleep.  Find activities that you enjoy each day.  Consider joining a support group. Your doctor may be able to suggest a support group.  Keep all follow-up visits as told by your doctor. This is important. Where to find more information: The [employer]ational Alliance on Mental Illness  www.nami.org  U.[employer] Institute of Mental Health  http://www.maynard.net/www.nimh.nih.gov  National Suicide Prevention Lifeline  773-744-52661-800-273-TALK (410) 398-5309(1-681-363-2394). This is free, 24-hour help.  Contact a doctor if:  Your symptoms get worse.  You have new symptoms.  You have trouble sleeping or doing your daily activities. Get help right away if:  You self-harm.  You have serious thoughts about hurting yourself or others.  You see, hear, taste, smell, or feel things that are not there (hallucinate). This information is not intended to replace advice given to you by your health care provider. Make sure you discuss any questions you have with your health care provider. Document Released: 11/19/2015 Document Revised: 08/01/2016 Document Reviewed: 08/01/2016 Elsevier Interactive Patient Education  2017 Elsevier Inc.  
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
## 2018-06-28 ENCOUNTER — Other Ambulatory Visit: Payer: Self-pay | Admitting: Family Medicine

## 2019-09-10 ENCOUNTER — Other Ambulatory Visit: Payer: Self-pay

## 2019-09-10 ENCOUNTER — Encounter (HOSPITAL_COMMUNITY): Payer: Self-pay | Admitting: Internal Medicine

## 2019-09-10 ENCOUNTER — Emergency Department (HOSPITAL_COMMUNITY): Payer: BC Managed Care – PPO

## 2019-09-10 ENCOUNTER — Observation Stay (HOSPITAL_COMMUNITY)
Admission: EM | Admit: 2019-09-10 | Discharge: 2019-09-11 | Disposition: A | Discharge status: Home / Self Care | Payer: BC Managed Care – PPO | Attending: Emergency Medicine | Admitting: Emergency Medicine

## 2019-09-10 DIAGNOSIS — Z8249 Family history of ischemic heart disease and other diseases of the circulatory system: Secondary | ICD-10-CM | POA: Insufficient documentation

## 2019-09-10 DIAGNOSIS — W208XXA Other cause of strike by thrown, projected or falling object, initial encounter: Secondary | ICD-10-CM | POA: Insufficient documentation

## 2019-09-10 DIAGNOSIS — E876 Hypokalemia: Secondary | ICD-10-CM | POA: Diagnosis not present

## 2019-09-10 DIAGNOSIS — S02849A Fracture of lateral orbital wall, unspecified side, initial encounter for closed fracture: Secondary | ICD-10-CM | POA: Insufficient documentation

## 2019-09-10 DIAGNOSIS — S299XXA Unspecified injury of thorax, initial encounter: Secondary | ICD-10-CM | POA: Diagnosis not present

## 2019-09-10 DIAGNOSIS — S0292XA Unspecified fracture of facial bones, initial encounter for closed fracture: Secondary | ICD-10-CM | POA: Diagnosis not present

## 2019-09-10 DIAGNOSIS — Z20828 Contact with and (suspected) exposure to other viral communicable diseases: Secondary | ICD-10-CM | POA: Insufficient documentation

## 2019-09-10 DIAGNOSIS — Z6835 Body mass index (BMI) 35.0-35.9, adult: Secondary | ICD-10-CM | POA: Diagnosis not present

## 2019-09-10 DIAGNOSIS — S0240EA Zygomatic fracture, right side, initial encounter for closed fracture: Secondary | ICD-10-CM | POA: Diagnosis not present

## 2019-09-10 DIAGNOSIS — S199XXA Unspecified injury of neck, initial encounter: Secondary | ICD-10-CM | POA: Diagnosis not present

## 2019-09-10 DIAGNOSIS — E669 Obesity, unspecified: Secondary | ICD-10-CM | POA: Insufficient documentation

## 2019-09-10 DIAGNOSIS — E872 Acidosis, unspecified: Secondary | ICD-10-CM | POA: Diagnosis present

## 2019-09-10 DIAGNOSIS — S0292XB Unspecified fracture of facial bones, initial encounter for open fracture: Secondary | ICD-10-CM | POA: Diagnosis not present

## 2019-09-10 DIAGNOSIS — R739 Hyperglycemia, unspecified: Secondary | ICD-10-CM | POA: Diagnosis not present

## 2019-09-10 DIAGNOSIS — Z833 Family history of diabetes mellitus: Secondary | ICD-10-CM | POA: Insufficient documentation

## 2019-09-10 DIAGNOSIS — Z79899 Other long term (current) drug therapy: Secondary | ICD-10-CM | POA: Diagnosis not present

## 2019-09-10 DIAGNOSIS — S01112A Laceration without foreign body of left eyelid and periocular area, initial encounter: Secondary | ICD-10-CM | POA: Diagnosis not present

## 2019-09-10 DIAGNOSIS — S0230XA Fracture of orbital floor, unspecified side, initial encounter for closed fracture: Secondary | ICD-10-CM | POA: Insufficient documentation

## 2019-09-10 DIAGNOSIS — F329 Major depressive disorder, single episode, unspecified: Secondary | ICD-10-CM | POA: Diagnosis not present

## 2019-09-10 DIAGNOSIS — I10 Essential (primary) hypertension: Secondary | ICD-10-CM | POA: Diagnosis not present

## 2019-09-10 DIAGNOSIS — R51 Headache: Secondary | ICD-10-CM | POA: Diagnosis not present

## 2019-09-10 DIAGNOSIS — E1165 Type 2 diabetes mellitus with hyperglycemia: Secondary | ICD-10-CM | POA: Insufficient documentation

## 2019-09-10 DIAGNOSIS — E119 Type 2 diabetes mellitus without complications: Secondary | ICD-10-CM

## 2019-09-10 DIAGNOSIS — R52 Pain, unspecified: Secondary | ICD-10-CM | POA: Diagnosis not present

## 2019-09-10 DIAGNOSIS — R001 Bradycardia, unspecified: Secondary | ICD-10-CM | POA: Diagnosis not present

## 2019-09-10 DIAGNOSIS — S0990XA Unspecified injury of head, initial encounter: Secondary | ICD-10-CM | POA: Diagnosis not present

## 2019-09-10 DIAGNOSIS — U070 Vaping-related disorder: Secondary | ICD-10-CM

## 2019-09-10 DIAGNOSIS — R0902 Hypoxemia: Secondary | ICD-10-CM | POA: Diagnosis not present

## 2019-09-10 DIAGNOSIS — F32A Depression, unspecified: Secondary | ICD-10-CM | POA: Diagnosis present

## 2019-09-10 DIAGNOSIS — S0231XA Fracture of orbital floor, right side, initial encounter for closed fracture: Secondary | ICD-10-CM | POA: Diagnosis not present

## 2019-09-10 DIAGNOSIS — Z03818 Encounter for observation for suspected exposure to other biological agents ruled out: Secondary | ICD-10-CM | POA: Diagnosis not present

## 2019-09-10 HISTORY — DX: Vaping-related disorder: U07.0

## 2019-09-10 LAB — COMPREHENSIVE METABOLIC PANEL
ALT: 49 U/L — ABNORMAL HIGH (ref 0–44)
AST: 30 U/L (ref 15–41)
Albumin: 4.4 g/dL (ref 3.5–5.0)
Alkaline Phosphatase: 102 U/L (ref 38–126)
Anion gap: 15 (ref 5–15)
BUN: 10 mg/dL (ref 6–20)
CO2: 22 mmol/L (ref 22–32)
Calcium: 9.5 mg/dL (ref 8.9–10.3)
Chloride: 98 mmol/L (ref 98–111)
Creatinine, Ser: 1.24 mg/dL (ref 0.61–1.24)
GFR calc Af Amer: >60 mL/min (ref >60)
GFR calc non Af Amer: >60 mL/min (ref >60)
Glucose, Bld: 330 mg/dL — ABNORMAL HIGH (ref 70–99)
Potassium: 3 mmol/L — ABNORMAL LOW (ref 3.5–5.1)
Sodium: 135 mmol/L (ref 135–145)
Total Bilirubin: 0.6 mg/dL (ref 0.3–1.2)
Total Protein: 7.3 g/dL (ref 6.5–8.1)

## 2019-09-10 LAB — CBC
HCT: 49.4 % (ref 39.0–52.0)
Hemoglobin: 18.2 g/dL — ABNORMAL HIGH (ref 13.0–17.0)
MCH: 33.2 pg (ref 26.0–34.0)
MCHC: 36.8 g/dL — ABNORMAL HIGH (ref 30.0–36.0)
MCV: 90.1 fL (ref 80.0–100.0)
Platelets: 275 10*3/uL (ref 150–400)
RBC: 5.48 MIL/uL (ref 4.22–5.81)
RDW: 11.1 % — ABNORMAL LOW (ref 11.5–15.5)
WBC: 9 10*3/uL (ref 4.0–10.5)
nRBC: 0.7 % — ABNORMAL HIGH (ref 0.0–0.2)

## 2019-09-10 LAB — URINALYSIS, ROUTINE W REFLEX MICROSCOPIC
Bacteria, UA: NONE SEEN
Bilirubin Urine: NEGATIVE
Glucose, UA: >=500 mg/dL — AB
Hgb urine dipstick: NEGATIVE
Ketones, ur: 20 mg/dL — AB
Leukocytes,Ua: NEGATIVE
Nitrite: NEGATIVE
Protein, ur: NEGATIVE mg/dL
Specific Gravity, Urine: 1.029 (ref 1.005–1.030)
pH: 6 (ref 5.0–8.0)

## 2019-09-10 LAB — PROTIME-INR
INR: 1 (ref 0.8–1.2)
Prothrombin Time: 13.3 seconds (ref 11.4–15.2)

## 2019-09-10 LAB — I-STAT CHEM 8, ED
BUN: 10 mg/dL (ref 6–20)
Calcium, Ion: 1.09 mmol/L — ABNORMAL LOW (ref 1.15–1.40)
Chloride: 99 mmol/L (ref 98–111)
Creatinine, Ser: 1.1 mg/dL (ref 0.61–1.24)
Glucose, Bld: 325 mg/dL — ABNORMAL HIGH (ref 70–99)
HCT: 51 % (ref 39.0–52.0)
Hemoglobin: 17.3 g/dL — ABNORMAL HIGH (ref 13.0–17.0)
Potassium: 3.2 mmol/L — ABNORMAL LOW (ref 3.5–5.1)
Sodium: 136 mmol/L (ref 135–145)
TCO2: 21 mmol/L — ABNORMAL LOW (ref 22–32)

## 2019-09-10 LAB — LACTIC ACID, PLASMA: Lactic Acid, Venous: 4.3 mmol/L (ref 0.5–1.9)

## 2019-09-10 LAB — ETHANOL: Alcohol, Ethyl (B): <10 mg/dL (ref <10)

## 2019-09-10 LAB — SAMPLE TO BLOOD BANK

## 2019-09-10 LAB — CDS SEROLOGY

## 2019-09-10 MED ORDER — SODIUM CHLORIDE 0.9 % IV SOLN
INTRAVENOUS | Status: DC
  Administered 2019-09-11: 08:00:00 via INTRAVENOUS

## 2019-09-10 MED ORDER — MORPHINE SULFATE (PF) 4 MG/ML IV SOLN
6.0000 mg | Freq: Once | INTRAVENOUS | Status: AC
  Administered 2019-09-10: 6 mg via INTRAVENOUS
  Filled 2019-09-10: qty 2

## 2019-09-10 MED ORDER — POTASSIUM CHLORIDE CRYS ER 20 MEQ PO TBCR
40.0000 meq | EXTENDED_RELEASE_TABLET | Freq: Once | ORAL | Status: AC
  Administered 2019-09-10: 40 meq via ORAL
  Filled 2019-09-10: qty 2

## 2019-09-10 MED ORDER — SODIUM CHLORIDE 0.9 % IV BOLUS
1000.0000 mL | Freq: Once | INTRAVENOUS | Status: AC
  Administered 2019-09-10: 1000 mL via INTRAVENOUS

## 2019-09-10 MED ORDER — LIDOCAINE HCL (PF) 2 % IJ SOLN
10.0000 mL | Freq: Once | INTRAMUSCULAR | Status: AC
  Administered 2019-09-10: 10 mL via INTRADERMAL
  Filled 2019-09-10: qty 10

## 2019-09-10 MED ORDER — LIDOCAINE HCL (PF) 2 % IJ SOLN: 2.0000 mL | Freq: Once | INTRAMUSCULAR | Status: DC

## 2019-09-10 MED ORDER — DEXAMETHASONE SODIUM PHOSPHATE 10 MG/ML IJ SOLN
10.0000 mg | Freq: Once | INTRAMUSCULAR | Status: AC
  Administered 2019-09-10: 10 mg via INTRAVENOUS
  Filled 2019-09-10: qty 1

## 2019-09-10 MED ORDER — NICOTINE 21 MG/24HR TD PT24: 21.0000 mg | MEDICATED_PATCH | Freq: Every day | TRANSDERMAL | Status: DC

## 2019-09-10 MED ORDER — FLUOXETINE HCL 20 MG PO TABS
80.0000 mg | ORAL_TABLET | Freq: Every day | ORAL | Status: DC
  Administered 2019-09-10: 80 mg via ORAL
  Filled 2019-09-10: qty 4

## 2019-09-10 MED ORDER — HYDROMORPHONE HCL 1 MG/ML IJ SOLN
1.0000 mg | Freq: Once | INTRAMUSCULAR | Status: AC
  Administered 2019-09-10: 1 mg via INTRAVENOUS
  Filled 2019-09-10: qty 1

## 2019-09-10 MED ORDER — INSULIN ASPART 100 UNIT/ML IV SOLN
3.0000 [IU] | Freq: Once | INTRAVENOUS | Status: AC
  Administered 2019-09-10: 3 [IU] via INTRAVENOUS

## 2019-09-10 MED ORDER — ONDANSETRON HCL 4 MG/2ML IJ SOLN
4.0000 mg | Freq: Once | INTRAMUSCULAR | Status: AC
  Administered 2019-09-10: 4 mg via INTRAVENOUS
  Filled 2019-09-10: qty 2

## 2019-09-10 MED ORDER — HYDROMORPHONE HCL 1 MG/ML IJ SOLN
1.0000 mg | INTRAMUSCULAR | Status: DC | PRN
  Administered 2019-09-10 – 2019-09-11 (×3): 1 mg via INTRAVENOUS
  Filled 2019-09-10 (×3): qty 1

## 2019-09-10 MED ORDER — AMOXICILLIN-POT CLAVULANATE 875-125 MG PO TABS
1.0000 | ORAL_TABLET | Freq: Once | ORAL | Status: AC
  Administered 2019-09-10: 1 via ORAL
  Filled 2019-09-10: qty 1

## 2019-09-10 NOTE — Progress Notes (Signed)
Orthopedic Tech Progress Note Patient Details:  Ricky Whitney 1976-01-04 [id number]  Patient ID: [name], male   DOB: [birth year], 43 y.o.   MRN: [id number]   [name] 09/10/2019, 5:09 PMlevel 2 Trauma

## 2019-09-10 NOTE — ED Notes (Signed)
pts mom, [place], would like an update at [phone]

## 2019-09-10 NOTE — ED Provider Notes (Signed)
[facility]   CSN: [id number] Arrival date & time: 09/10/19  1614     History   Chief Complaint Chief Complaint  Patient presents with   Trauma   Head Injury    HPI Roni Breadhomas L [place] is a 43 y.o. male who presents emergency department as a level 2 trauma activation after sustaining a head injury.  Patient reports he was working on his pickup truck with it on a jack and he was underneath it when the transmission fell on his face.  Patient reports a transmission fell on the right side of his face and he has tenderness and swelling to that area.  Patient denies being on any blood thinners.  Patient is not sure if he lost consciousness.  Patient was able to call EMS on his own and was transported to emergency department for further evaluation.  Patient denies any neck pain or back pain and has no difficulty breathing.  Patient reports when he bites down things feel abnormal and that he feels like his right eyes swollen and watery.  Patient denies vision changes.  Patient reports his tetanus vaccine is up-to-date.     The history is provided by the patient and the EMS personnel.    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 No pertinent surgical history.      Home Medications    Prior to Admission medications   Medication Sig Start Date End Date Taking? Authorizing Provider  FLUoxetine (PROZAC) 20 MG tablet Take 80 mg by mouth at bedtime.   Yes [provider]    Family  History Family History  Problem Relation Age of Onset   Diabetes Mellitus II Mother    Hypertension Mother    Heart attack Mother    Diabetes Mellitus II Father    Hypertension Father    Diabetes Mellitus II Brother    Hypertension Brother     Social History Social History   Tobacco Use   Smoking status: Never Smoker   Smokeless tobacco: Never Used  Substance Use Topics   Alcohol use: Yes   Drug use: Yes    Comment: vaping     Allergies   Patient has no known allergies.   Review of Systems Review of Systems  Constitutional: Positive for diaphoresis. Negative for fever.  HENT: Positive for facial swelling and sinus pain. Negative for trouble swallowing and voice change.   Eyes: Positive for discharge.  Respiratory: Negative for cough and shortness of breath.   Cardiovascular: Negative for chest pain.  Gastrointestinal: Positive for nausea. Negative for abdominal pain, constipation, diarrhea and vomiting.  Genitourinary: Negative for difficulty urinating and flank pain.  Musculoskeletal: Negative for back pain.  Skin: Positive for wound.  Neurological: Positive for headaches. Negative for speech difficulty and numbness.  Psychiatric/Behavioral: Negative for confusion.     Physical Exam Updated Vital Signs BP (!) 108/56    Pulse (!) 56    Resp 11    Ht 6' (1.829 m)    Wt 117.9  kg    SpO2 96%    BMI 35.26 kg/m   Physical Exam Constitutional:      Appearance: He is obese. He is diaphoretic.  HENT:     Head: Normocephalic.     Comments: Tenderness and swelling to R side of face, small approximately 1.5 cm laceration between R eye and nose, small approximately 1 cm skin tear on right upper eyelid    Right Ear: External ear normal.     Left Ear: External ear normal.     Nose: Nose normal.     Mouth/Throat:     Mouth: Mucous membranes are moist.     Pharynx: Oropharynx is clear.  Eyes:     General: Vision grossly intact.     Extraocular Movements:  Extraocular movements intact.     Conjunctiva/sclera:     Right eye: Right conjunctiva is injected.     Pupils: Pupils are equal, round, and reactive to light.     Comments: Approximately 1 cm superficial laceration to R upper eyelid  Neck:     Comments: c-collar in place Cardiovascular:     Rate and Rhythm: Normal rate and regular rhythm.     Pulses: Normal pulses.  Pulmonary:     Effort: Pulmonary effort is normal. No respiratory distress.     Breath sounds: No wheezing or rhonchi.  Chest:     Chest wall: No tenderness.  Abdominal:     Palpations: Abdomen is soft.     Tenderness: There is no abdominal tenderness. There is no guarding or rebound.  Musculoskeletal:     Right lower leg: No edema.     Left lower leg: No edema.  Skin:    Findings: Abrasion and laceration present.  Neurological:     General: No focal deficit present.     Mental Status: He is alert.     GCS: GCS eye subscore is 4. GCS verbal subscore is 5. GCS motor subscore is 6.     Sensory: Sensation is intact.     Motor: Motor function is intact. No weakness.      ED Treatments / Results  Labs (all labs ordered are listed, but only abnormal results are displayed) Labs Reviewed  COMPREHENSIVE METABOLIC PANEL - Abnormal; Notable for the following components:      Result Value   Potassium 3.0 (*)    Glucose, Bld 330 (*)    ALT 49 (*)    All other components within normal limits  CBC - Abnormal; Notable for the following components:   Hemoglobin 18.2 (*)    MCHC 36.8 (*)    RDW 11.1 (*)    nRBC 0.7 (*)    All other components within normal limits  URINALYSIS, ROUTINE W REFLEX MICROSCOPIC - Abnormal; Notable for the following components:   Color, Urine STRAW (*)    Glucose, UA >=500 (*)    Ketones, ur 20 (*)    All other components within normal limits  LACTIC ACID, PLASMA - Abnormal; Notable for the following components:   Lactic Acid, Venous 4.3 (*)    All other components within normal limits   I-STAT CHEM 8, ED - Abnormal; Notable for the following components:   Potassium 3.2 (*)    Glucose, Bld 325 (*)    Calcium, Ion 1.09 (*)    TCO2 21 (*)    Hemoglobin 17.3 (*)    All other components within normal limits  SARS CORONAVIRUS 2 (TAT 6-24 HRS)  CDS SEROLOGY  ETHANOL  PROTIME-INR  LACTIC ACID, PLASMA  LACTIC ACID, PLASMA  SAMPLE TO BLOOD BANK    EKG EKG Interpretation  Date/Time:  Saturday September 10 2019 16:16:45 EDT Ventricular Rate:  71 PR Interval:    QRS Duration: 104 QT Interval:  440 QTC Calculation: 479 R Axis:   97 Text Interpretation:  Sinus rhythm Confirmed by Blane OharaZavitz, Joshua [phone]) on 09/10/2019 6:09:28 PM   Radiology Ct Head Wo Contrast  Result Date: 09/10/2019 CLINICAL DATA:  Head trauma, transmission fell on head EXAM: CT HEAD WITHOUT CONTRAST CT MAXILLOFACIAL WITHOUT CONTRAST CT CERVICAL SPINE WITHOUT CONTRAST TECHNIQUE: Multidetector CT imaging of the head, cervical spine, and maxillofacial structures were performed using the standard protocol without intravenous contrast. Multiplanar CT image reconstructions of the cervical spine and maxillofacial structures were also generated. COMPARISON:  08/23/2005 FINDINGS: CT HEAD FINDINGS Brain: No evidence of acute infarction, hemorrhage, hydrocephalus, extra-axial collection or mass lesion/mass effect. Vascular: No hyperdense vessel or unexpected calcification. CT FACIAL BONES FINDINGS Skull: Normal. Negative for fracture or focal lesion. Facial bones: There is a substantial displaced, tripod morphology fracture of the right zygoma and orbit, with comminuted and displaced fracture fragments particularly involving the orbital floor and lateral maxillary wall (series 5, image 41, 50, series 9, image 47, 42). There are additional depressed fractures of the right lamina papyracea. The superior orbit, ethmoid, and frontal sinuses appear intact as well as the hard palate and pterygoid. There are no left-sided facial  fractures appreciated. Sinuses/Orbits: There is fat herniation through the displaced right orbital floor without rectus herniation. The right globe and orbital contents are grossly intact. Other: None. CT CERVICAL SPINE FINDINGS Alignment: Normal. Skull base and vertebrae: No acute fracture. No primary bone lesion or focal pathologic process. Soft tissues and spinal canal: No prevertebral fluid or swelling. No visible canal hematoma. Disc levels: Mild multilevel disc space height loss and osteophytosis. Upper chest: Negative. Other: None. IMPRESSION: 1.  No acute intracranial pathology. 2. There is a substantial displaced, tripod morphology fracture of the right zygoma and orbit, with comminuted and displaced fracture fragments particularly involving the orbital floor and lateral maxillary wall (series 5, image 41, 50, series 9, image 47, 42). There are additional depressed fractures of the right lamina papyracea. The superior orbit, ethmoid, and frontal sinuses appear intact as well as the hard palate and pterygoid. There are no left-sided facial fractures appreciated. 3. There is fat herniation through the displaced right orbital floor without rectus herniation. The right globe and orbital contents are grossly intact. 4.  No fracture or static subluxation of the cervical spine. Electronically Signed   By: Lauralyn PrimesAlex  Bibbey M.D.   On: 09/10/2019 16:58   Ct Cervical Spine Wo Contrast  Result Date: 09/10/2019 CLINICAL DATA:  Head trauma, transmission fell on head EXAM: CT HEAD WITHOUT CONTRAST CT MAXILLOFACIAL WITHOUT CONTRAST CT CERVICAL SPINE WITHOUT CONTRAST TECHNIQUE: Multidetector CT imaging of the head, cervical spine, and maxillofacial structures were performed using the standard protocol without intravenous contrast. Multiplanar CT image reconstructions of the cervical spine and maxillofacial structures were also generated. COMPARISON:  08/23/2005 FINDINGS: CT HEAD FINDINGS Brain: No evidence of acute  infarction, hemorrhage, hydrocephalus, extra-axial collection or mass lesion/mass effect. Vascular: No hyperdense vessel or unexpected calcification. CT FACIAL BONES FINDINGS Skull: Normal. Negative for fracture or focal lesion. Facial bones: There is a substantial displaced, tripod morphology fracture of the right zygoma and orbit, with comminuted and displaced fracture fragments particularly involving the orbital floor and lateral maxillary wall (series  5, image 41, 50, series 9, image 47, 42). There are additional depressed fractures of the right lamina papyracea. The superior orbit, ethmoid, and frontal sinuses appear intact as well as the hard palate and pterygoid. There are no left-sided facial fractures appreciated. Sinuses/Orbits: There is fat herniation through the displaced right orbital floor without rectus herniation. The right globe and orbital contents are grossly intact. Other: None. CT CERVICAL SPINE FINDINGS Alignment: Normal. Skull base and vertebrae: No acute fracture. No primary bone lesion or focal pathologic process. Soft tissues and spinal canal: No prevertebral fluid or swelling. No visible canal hematoma. Disc levels: Mild multilevel disc space height loss and osteophytosis. Upper chest: Negative. Other: None. IMPRESSION: 1.  No acute intracranial pathology. 2. There is a substantial displaced, tripod morphology fracture of the right zygoma and orbit, with comminuted and displaced fracture fragments particularly involving the orbital floor and lateral maxillary wall (series 5, image 41, 50, series 9, image 47, 42). There are additional depressed fractures of the right lamina papyracea. The superior orbit, ethmoid, and frontal sinuses appear intact as well as the hard palate and pterygoid. There are no left-sided facial fractures appreciated. 3. There is fat herniation through the displaced right orbital floor without rectus herniation. The right globe and orbital contents are grossly intact.  4.  No fracture or static subluxation of the cervical spine. Electronically Signed   By: Lauralyn Primes M.D.   On: 09/10/2019 16:58   Ct 3d Recon At Scanner  Result Date: 09/10/2019 CLINICAL DATA:  Facial bone fracture EXAM: 3-DIMENSIONAL CT IMAGE RENDERING ON ACQUISITION WORKSTATION TECHNIQUE: 3-dimensional CT images were rendered by post-processing of the original CT data on an acquisition workstation. The 3-dimensional CT images were interpreted and findings were reported in the accompanying complete CT report for this study COMPARISON:  None. FINDINGS: 3D reformatted images from maxillofacial CT were generated. Please see dedicated maxillofacial CT report for detailed fracture characterization. IMPRESSION: As above. Electronically Signed   By: Duanne Guess M.D.   On: 09/10/2019 20:42   Dg Chest Port 1 View  Result Date: 09/10/2019 CLINICAL DATA:  Trauma EXAM: PORTABLE CHEST 1 VIEW COMPARISON:  None. FINDINGS: Lungs are clear.  No pleural effusion or pneumothorax. The heart is normal in size. IMPRESSION: No evidence of acute cardiopulmonary disease. Electronically Signed   By: Charline Bills M.D.   On: 09/10/2019 16:41   Ct Maxillofacial Wo Contrast  Result Date: 09/10/2019 CLINICAL DATA:  Head trauma, transmission fell on head EXAM: CT HEAD WITHOUT CONTRAST CT MAXILLOFACIAL WITHOUT CONTRAST CT CERVICAL SPINE WITHOUT CONTRAST TECHNIQUE: Multidetector CT imaging of the head, cervical spine, and maxillofacial structures were performed using the standard protocol without intravenous contrast. Multiplanar CT image reconstructions of the cervical spine and maxillofacial structures were also generated. COMPARISON:  08/23/2005 FINDINGS: CT HEAD FINDINGS Brain: No evidence of acute infarction, hemorrhage, hydrocephalus, extra-axial collection or mass lesion/mass effect. Vascular: No hyperdense vessel or unexpected calcification. CT FACIAL BONES FINDINGS Skull: Normal. Negative for fracture or focal  lesion. Facial bones: There is a substantial displaced, tripod morphology fracture of the right zygoma and orbit, with comminuted and displaced fracture fragments particularly involving the orbital floor and lateral maxillary wall (series 5, image 41, 50, series 9, image 47, 42). There are additional depressed fractures of the right lamina papyracea. The superior orbit, ethmoid, and frontal sinuses appear intact as well as the hard palate and pterygoid. There are no left-sided facial fractures appreciated. Sinuses/Orbits: There is fat herniation through the displaced  right orbital floor without rectus herniation. The right globe and orbital contents are grossly intact. Other: None. CT CERVICAL SPINE FINDINGS Alignment: Normal. Skull base and vertebrae: No acute fracture. No primary bone lesion or focal pathologic process. Soft tissues and spinal canal: No prevertebral fluid or swelling. No visible canal hematoma. Disc levels: Mild multilevel disc space height loss and osteophytosis. Upper chest: Negative. Other: None. IMPRESSION: 1.  No acute intracranial pathology. 2. There is a substantial displaced, tripod morphology fracture of the right zygoma and orbit, with comminuted and displaced fracture fragments particularly involving the orbital floor and lateral maxillary wall (series 5, image 41, 50, series 9, image 47, 42). There are additional depressed fractures of the right lamina papyracea. The superior orbit, ethmoid, and frontal sinuses appear intact as well as the hard palate and pterygoid. There are no left-sided facial fractures appreciated. 3. There is fat herniation through the displaced right orbital floor without rectus herniation. The right globe and orbital contents are grossly intact. 4.  No fracture or static subluxation of the cervical spine. Electronically Signed   By: Lauralyn Primes M.D.   On: 09/10/2019 16:58    Procedures .Marland KitchenLaceration Repair  Date/Time: 09/10/2019 9:52 PM Performed by:  Ignacia Palma, MD Authorized by: Blane Ohara, MD   Consent:    Consent obtained:  Verbal   Consent given by:  Patient Laceration details:    Location:  Face   Length (cm):  1.5 Repair type:    Repair type:  Simple Treatment:    Area cleansed with:  Saline   Irrigation method:  Syringe Skin repair:    Repair method:  Sutures   Suture size:  6-0   Suture material:  Chromic gut   Suture technique:  Simple interrupted   Number of sutures:  1 Post-procedure details:    Dressing:  Open (no dressing)   (including critical care time)  Medications Ordered in ED Medications  FLUoxetine (PROZAC) tablet 80 mg (80 mg Oral Given 09/10/19 2235)  0.9 %  sodium chloride infusion (has no administration in time range)  nicotine (NICODERM CQ - dosed in mg/24 hours) patch 21 mg (has no administration in time range)  HYDROmorphone (DILAUDID) injection 1 mg (1 mg Intravenous Given 09/11/19 0102)  ondansetron (ZOFRAN) injection 4 mg (4 mg Intravenous Given 09/10/19 1652)  morphine 4 MG/ML injection 6 mg (6 mg Intravenous Given 09/10/19 1652)  dexamethasone (DECADRON) injection 10 mg (10 mg Intravenous Given 09/10/19 1810)  potassium chloride SA (K-DUR) CR tablet 40 mEq (40 mEq Oral Given 09/10/19 1848)  sodium chloride 0.9 % bolus 1,000 mL (0 mLs Intravenous Stopped 09/10/19 1848)  HYDROmorphone (DILAUDID) injection 1 mg (1 mg Intravenous Given 09/10/19 1848)  lidocaine (XYLOCAINE) 2 % injection 10 mL (10 mLs Intradermal Given 09/10/19 1953)  amoxicillin-clavulanate (AUGMENTIN) 875-125 MG per tablet 1 tablet (1 tablet Oral Given 09/10/19 2109)  potassium chloride SA (K-DUR) CR tablet 40 mEq (40 mEq Oral Given 09/10/19 2235)  sodium chloride 0.9 % bolus 1,000 mL (1,000 mLs Intravenous New Bag/Given 09/10/19 2238)  insulin aspart (novoLOG) injection 3 Units (3 Units Intravenous Given 09/10/19 2334)     Initial Impression / Assessment and Plan / ED Course  I have reviewed the triage vital signs and  the nursing notes.  Pertinent labs & imaging results that were available during my care of the patient were reviewed by me and considered in my medical decision making (see chart for details).       Level  2 trauma activation.  Patient had a GCS of 15 on arrival with intact airway, breathing, and circulation.  Right facial swelling and tenderness concerning for underlying fracture.  Patient has intact vision with intact extraocular movements and pupils are equal and reactive.  No signs of ocular entrapment.  Patient reports tetanus is up-to-date.  Patient given IV pain medication and IV fluid while in the emergency department.  Patient was found to be hypokalemic and potassium was repleted.  Patient had several episodes of bradycardia to the 40s during which he was diaphoretic.  Patient's bradycardic episodes may be due to external pressure from swelling on orbit. patient with likely concussion.  CT scans revealed tripod morphology fracture of the right zygoma and orbit with comminuted and displaced fracture fragments involving the orbital floor and lateral maxillary wall.  Dr. Kenney Houseman with facial trauma consulted who recommended no acute surgical intervention, 10 mg of IV Decadron for swelling, 10-day course of Augmentin, and close outpatient follow-up for outpatient surgical intervention after improvement of swelling. Dr. Kenney Houseman also recommended patient maintain sinus precautions.  Patient was given a dose of Augmentin while in the emergency department.   Trauma surgery was consulted for admission given patient's level of symptoms, pain control, mechanism of injury, and bradycardic/diaphoretic episodes.  Patient was admitted to trauma surgery for further evaluation and management.  Patient seen and plan discussed with Dr. Jodi Mourning.  Final Clinical Impressions(s) / ED Diagnoses   Final diagnoses:  Multiple facial fractures, open, initial encounter [facility])    ED Discharge Orders    None        Ignacia Palma, MD 09/11/19 Kandee Keen    Blane Ohara, MD 09/11/19 [phone]

## 2019-09-10 NOTE — ED Notes (Signed)
Family updated as to patient's status.

## 2019-09-10 NOTE — ED Triage Notes (Signed)
Per EMS- pt was working under a car. Transmission fell on top of his head. Pt unsure if he had LOC. Awake/called EMS. A&OX4. Pt has swelling to face. Bradycardic in 40s  Upon arrival. Hypertensive 970Y systolic.

## 2019-09-10 NOTE — Consult Note (Addendum)
Medical Consultation   Ricky RAMONE  Whitney:[id number]  DOB: [birth year]  DOA: 09/10/2019  PCP: Patient, No Pcp Per   Outpatient Specialists:    Requesting physician: Dr. Dwain Sarna  Reason for consultation: Due to hyperglycemia   History of Present Illness: Ricky Whitney is an 43 y.o. male depression, vaping, who presents with head trauma.  Pt states that he was injured today when was working on his pickup truck with it on a jack, and he was underneath it when the transmission fell on his face. He states that the transmission fell on the right side of his face. No LOC. No injury to neck, chest, abdomen or back. He developed right facial pain, particularly around the right eye area. He feels like his right eyes swollen and watery.  Patient denies vision changes.  Patient does not have headache, neck pain, chest pain, shortness of breath, cough.  No fever or chills.  Denies nausea, vomiting, diarrhea, abdominal pain, symptoms of UTI or unilateral weakness.  Patient states that he drinks a lot of Coke with sugar normally.  Work-up in the ED showed multiple facial bone fracture by CT scan.  He was found to have elevated blood sugar 330 on BMP. Pt is admitted by trauma surgeon, Dr. Dwain Sarna.  We are asked to consult due to hypoglycemia.  ED course:  Patient was found to have WBC 9.0, lactic acid 4.3, INR 1.0, urinalysis negative for UTI, but positive for ketones and glucose>500, pending COVID-19 test, alcohol level less than 10, potassium 3.0, renal function okay, blood sugar 325, bicarbonate 22, anion gap 15.  Blood pressure 158/85, 115/71, bradycardia, oxygen saturation initially 87% on room air, currently 100% on room air.  No fever.  Chest x-ray negative.  CT of C-spine negative.    CT of the maxillofacial and CT of head showed: 1. There is a substantial displaced, tripod morphology fracture of the right zygoma and orbit, with comminuted and displaced fracture fragments  particularly involving the orbital floor and lateral maxillary wall (series 5, image 41, 50, series 9, image 47, 42). There are additional depressed fractures of the right lamina papyracea. The superior orbit, ethmoid, and frontal sinuses appear intact as well as the hard palate and pterygoid. There are no left-sided facial fractures appreciated.  2. There is fat herniation through the displaced right orbital floor without rectus herniation. The right globe and orbital contents are grossly intact.  3.  No acute intracranial abnormalities.  Review of Systems:   General: no fevers, chills, no changes in body weight, no changes in appetite Skin: no rash HEENT: no blurry vision, hearing changes or sore throat. Pt has tenderness to the right face and small skin laceration in right eyelid and nose. Pulm: no dyspnea, coughing, wheezing CV: no chest pain, palpitations, shortness of breath Abd: no nausea/vomiting, abdominal pain, diarrhea/constipation GU: no dysuria, hematuria, polyuria Ext: no arthralgias, myalgias Neuro: no weakness, numbness, or tingling  Past Medical History: Past Medical History:  Diagnosis Date   Depression    Vaping-Related Disorder     Past Surgical History: History reviewed. No pertinent surgical history.   Allergies:  No Known Allergies   Social History:  reports that he has never smoked. He has never used smokeless tobacco. He reports current alcohol use. He reports current drug use. pt do vaping.   Family History: Family History  Problem Relation Age of Onset   Diabetes  Mellitus II Mother    Hypertension Mother    Heart attack Mother    Diabetes Mellitus II Father    Hypertension Father    Diabetes Mellitus II Brother    Hypertension Brother     Physical Exam: Vitals:   09/10/19 2130 09/10/19 2145 09/10/19 2200 09/10/19 2215  BP: 115/71 124/65 120/61 126/76  Pulse:      Resp: 14 13 12 15   SpO2:      Weight:      Height:         General: Not in acute distress HEENT: PERRL, EOMI, no scleral icterus, No JVD or bruit.  Pt has tenderness and swelling to the right face and small skin laceration in right eyelid and nose. Has dry blood on the right upper face. Cardiac: S1/S2, RRR, No murmurs, gallops or rubs Pulm:  Clear to auscultation bilaterally. No rales, wheezing, rhonchi or rubs. Abd: Soft, nondistended, nontender, no rebound pain, no organomegaly, BS present Ext: No edema. 2+DP/PT pulse bilaterally Musculoskeletal: No joint deformities, erythema, or stiffness, ROM full Skin: No rashes.  Neuro: Alert and oriented X3, cranial nerves II-XII grossly intact, moves all extremities normally Psych: Patient is not psychotic, no suicidal or hemocidal ideation.  Data reviewed:  I have personally reviewed following labs and imaging studies Labs:  CBC: Recent Labs  Lab 09/10/19 1620 09/10/19 1628  WBC 9.0  --   HGB 18.2* 17.3*  HCT 49.4 51.0  MCV 90.1  --   PLT 275  --     Basic Metabolic Panel: Recent Labs  Lab 09/10/19 1620 09/10/19 1628  NA 135 136  K 3.0* 3.2*  CL 98 99  CO2 22  --   GLUCOSE 330* 325*  BUN 10 10  CREATININE 1.24 1.10  CALCIUM 9.5  --    GFR Estimated Creatinine Clearance: 117.1 mL/min (by C-G formula based on SCr of 1.1 mg/dL). Liver Function Tests: Recent Labs  Lab 09/10/19 1620  AST 30  ALT 49*  ALKPHOS 102  BILITOT 0.6  PROT 7.3  ALBUMIN 4.4   No results for input(s): LIPASE, AMYLASE in the last 168 hours. No results for input(s): AMMONIA in the last 168 hours. Coagulation profile Recent Labs  Lab 09/10/19 1620  INR 1.0    Cardiac Enzymes: No results for input(s): CKTOTAL, CKMB, CKMBINDEX, TROPONINI in the last 168 hours. BNP: Invalid input(s): POCBNP CBG: No results for input(s): GLUCAP in the last 168 hours. D-Dimer No results for input(s): DDIMER in the last 72 hours. Hgb A1c No results for input(s): HGBA1C in the last 72 hours. Lipid Profile No  results for input(s): CHOL, HDL, LDLCALC, TRIG, CHOLHDL, LDLDIRECT in the last 72 hours. Thyroid function studies No results for input(s): TSH, T4TOTAL, T3FREE, THYROIDAB in the last 72 hours.  Invalid input(s): FREET3 Anemia work up No results for input(s): VITAMINB12, FOLATE, FERRITIN, TIBC, IRON, RETICCTPCT in the last 72 hours. Urinalysis    Component Value Date/Time   COLORURINE STRAW (A) 09/10/2019 [place] 09/10/2019 1954   LABSPEC 1.029 09/10/2019 1954   PHURINE 6.0 09/10/2019 1954   GLUCOSEU >=500 (A) 09/10/2019 [place] NEGATIVE 09/10/2019 [place] NEGATIVE 09/10/2019 1954   KETONESUR 20 (A) 09/10/2019 [place] NEGATIVE 09/10/2019 1954   NITRITE NEGATIVE 09/10/2019 [place] NEGATIVE 09/10/2019 1954     Microbiology No results found for this or any previous visit (from the past 240 hour(s)).  Anti-infectives (From admission,  onward)   Start     Dose/Rate Route Frequency Ordered Stop   09/10/19 2030  amoxicillin-clavulanate (AUGMENTIN) 875-125 MG per tablet 1 tablet     1 tablet Oral  Once 09/10/19 2021 09/10/19 2109      Inpatient Medications:   Scheduled Meds:  FLUoxetine  80 mg Oral QHS   nicotine  21 mg Transdermal Daily   Continuous Infusions:  sodium chloride       Radiological Exams on Admission: Ct Head Wo Contrast  Result Date: 09/10/2019 CLINICAL DATA:  Head trauma, transmission fell on head EXAM: CT HEAD WITHOUT CONTRAST CT MAXILLOFACIAL WITHOUT CONTRAST CT CERVICAL SPINE WITHOUT CONTRAST TECHNIQUE: Multidetector CT imaging of the head, cervical spine, and maxillofacial structures were performed using the standard protocol without intravenous contrast. Multiplanar CT image reconstructions of the cervical spine and maxillofacial structures were also generated. COMPARISON:  08/23/2005 FINDINGS: CT HEAD FINDINGS Brain: No evidence of acute infarction, hemorrhage, hydrocephalus, extra-axial collection  or mass lesion/mass effect. Vascular: No hyperdense vessel or unexpected calcification. CT FACIAL BONES FINDINGS Skull: Normal. Negative for fracture or focal lesion. Facial bones: There is a substantial displaced, tripod morphology fracture of the right zygoma and orbit, with comminuted and displaced fracture fragments particularly involving the orbital floor and lateral maxillary wall (series 5, image 41, 50, series 9, image 47, 42). There are additional depressed fractures of the right lamina papyracea. The superior orbit, ethmoid, and frontal sinuses appear intact as well as the hard palate and pterygoid. There are no left-sided facial fractures appreciated. Sinuses/Orbits: There is fat herniation through the displaced right orbital floor without rectus herniation. The right globe and orbital contents are grossly intact. Other: None. CT CERVICAL SPINE FINDINGS Alignment: Normal. Skull base and vertebrae: No acute fracture. No primary bone lesion or focal pathologic process. Soft tissues and spinal canal: No prevertebral fluid or swelling. No visible canal hematoma. Disc levels: Mild multilevel disc space height loss and osteophytosis. Upper chest: Negative. Other: None. IMPRESSION: 1.  No acute intracranial pathology. 2. There is a substantial displaced, tripod morphology fracture of the right zygoma and orbit, with comminuted and displaced fracture fragments particularly involving the orbital floor and lateral maxillary wall (series 5, image 41, 50, series 9, image 47, 42). There are additional depressed fractures of the right lamina papyracea. The superior orbit, ethmoid, and frontal sinuses appear intact as well as the hard palate and pterygoid. There are no left-sided facial fractures appreciated. 3. There is fat herniation through the displaced right orbital floor without rectus herniation. The right globe and orbital contents are grossly intact. 4.  No fracture or static subluxation of the cervical spine.  Electronically Signed   By: Lauralyn PrimesAlex  Bibbey M.D.   On: 09/10/2019 16:58   Ct Cervical Spine Wo Contrast  Result Date: 09/10/2019 CLINICAL DATA:  Head trauma, transmission fell on head EXAM: CT HEAD WITHOUT CONTRAST CT MAXILLOFACIAL WITHOUT CONTRAST CT CERVICAL SPINE WITHOUT CONTRAST TECHNIQUE: Multidetector CT imaging of the head, cervical spine, and maxillofacial structures were performed using the standard protocol without intravenous contrast. Multiplanar CT image reconstructions of the cervical spine and maxillofacial structures were also generated. COMPARISON:  08/23/2005 FINDINGS: CT HEAD FINDINGS Brain: No evidence of acute infarction, hemorrhage, hydrocephalus, extra-axial collection or mass lesion/mass effect. Vascular: No hyperdense vessel or unexpected calcification. CT FACIAL BONES FINDINGS Skull: Normal. Negative for fracture or focal lesion. Facial bones: There is a substantial displaced, tripod morphology fracture of the right zygoma and orbit, with comminuted and displaced fracture fragments  particularly involving the orbital floor and lateral maxillary wall (series 5, image 41, 50, series 9, image 47, 42). There are additional depressed fractures of the right lamina papyracea. The superior orbit, ethmoid, and frontal sinuses appear intact as well as the hard palate and pterygoid. There are no left-sided facial fractures appreciated. Sinuses/Orbits: There is fat herniation through the displaced right orbital floor without rectus herniation. The right globe and orbital contents are grossly intact. Other: None. CT CERVICAL SPINE FINDINGS Alignment: Normal. Skull base and vertebrae: No acute fracture. No primary bone lesion or focal pathologic process. Soft tissues and spinal canal: No prevertebral fluid or swelling. No visible canal hematoma. Disc levels: Mild multilevel disc space height loss and osteophytosis. Upper chest: Negative. Other: None. IMPRESSION: 1.  No acute intracranial pathology. 2.  There is a substantial displaced, tripod morphology fracture of the right zygoma and orbit, with comminuted and displaced fracture fragments particularly involving the orbital floor and lateral maxillary wall (series 5, image 41, 50, series 9, image 47, 42). There are additional depressed fractures of the right lamina papyracea. The superior orbit, ethmoid, and frontal sinuses appear intact as well as the hard palate and pterygoid. There are no left-sided facial fractures appreciated. 3. There is fat herniation through the displaced right orbital floor without rectus herniation. The right globe and orbital contents are grossly intact. 4.  No fracture or static subluxation of the cervical spine. Electronically Signed   By: Lauralyn PrimesAlex  Bibbey M.D.   On: 09/10/2019 16:58   Ct 3d Recon At Scanner  Result Date: 09/10/2019 CLINICAL DATA:  Facial bone fracture EXAM: 3-DIMENSIONAL CT IMAGE RENDERING ON ACQUISITION WORKSTATION TECHNIQUE: 3-dimensional CT images were rendered by post-processing of the original CT data on an acquisition workstation. The 3-dimensional CT images were interpreted and findings were reported in the accompanying complete CT report for this study COMPARISON:  None. FINDINGS: 3D reformatted images from maxillofacial CT were generated. Please see dedicated maxillofacial CT report for detailed fracture characterization. IMPRESSION: As above. Electronically Signed   By: Duanne GuessNicholas  Plundo M.D.   On: 09/10/2019 20:42   Dg Chest Port 1 View  Result Date: 09/10/2019 CLINICAL DATA:  Trauma EXAM: PORTABLE CHEST 1 VIEW COMPARISON:  None. FINDINGS: Lungs are clear.  No pleural effusion or pneumothorax. The heart is normal in size. IMPRESSION: No evidence of acute cardiopulmonary disease. Electronically Signed   By: Charline BillsSriyesh  Krishnan M.D.   On: 09/10/2019 16:41   Ct Maxillofacial Wo Contrast  Result Date: 09/10/2019 CLINICAL DATA:  Head trauma, transmission fell on head EXAM: CT HEAD WITHOUT CONTRAST CT  MAXILLOFACIAL WITHOUT CONTRAST CT CERVICAL SPINE WITHOUT CONTRAST TECHNIQUE: Multidetector CT imaging of the head, cervical spine, and maxillofacial structures were performed using the standard protocol without intravenous contrast. Multiplanar CT image reconstructions of the cervical spine and maxillofacial structures were also generated. COMPARISON:  08/23/2005 FINDINGS: CT HEAD FINDINGS Brain: No evidence of acute infarction, hemorrhage, hydrocephalus, extra-axial collection or mass lesion/mass effect. Vascular: No hyperdense vessel or unexpected calcification. CT FACIAL BONES FINDINGS Skull: Normal. Negative for fracture or focal lesion. Facial bones: There is a substantial displaced, tripod morphology fracture of the right zygoma and orbit, with comminuted and displaced fracture fragments particularly involving the orbital floor and lateral maxillary wall (series 5, image 41, 50, series 9, image 47, 42). There are additional depressed fractures of the right lamina papyracea. The superior orbit, ethmoid, and frontal sinuses appear intact as well as the hard palate and pterygoid. There are no left-sided facial  fractures appreciated. Sinuses/Orbits: There is fat herniation through the displaced right orbital floor without rectus herniation. The right globe and orbital contents are grossly intact. Other: None. CT CERVICAL SPINE FINDINGS Alignment: Normal. Skull base and vertebrae: No acute fracture. No primary bone lesion or focal pathologic process. Soft tissues and spinal canal: No prevertebral fluid or swelling. No visible canal hematoma. Disc levels: Mild multilevel disc space height loss and osteophytosis. Upper chest: Negative. Other: None. IMPRESSION: 1.  No acute intracranial pathology. 2. There is a substantial displaced, tripod morphology fracture of the right zygoma and orbit, with comminuted and displaced fracture fragments particularly involving the orbital floor and lateral maxillary wall (series 5,  image 41, 50, series 9, image 47, 42). There are additional depressed fractures of the right lamina papyracea. The superior orbit, ethmoid, and frontal sinuses appear intact as well as the hard palate and pterygoid. There are no left-sided facial fractures appreciated. 3. There is fat herniation through the displaced right orbital floor without rectus herniation. The right globe and orbital contents are grossly intact. 4.  No fracture or static subluxation of the cervical spine. Electronically Signed   By: Lauralyn Primes M.D.   On: 09/10/2019 16:58    Impression/Recommendations Principal Problem:   Facial fracture (HCC) Active Problems:   Hyperglycemia   Depression   Hypokalemia   Bradycardia   Vaping-Related Disorder   New onset type 2 diabetes mellitus (HCC)   Lactic acid acidosis  Facial fracture (HCC): CT head is negative for acute intracranial abnormalities.  CT of C-spine is negative.  CT scan showed multiple facial fracture.  -Patient is place on tele bed for obs by trauma team -pain control: PRN Dilaudid and oxycodone per Dr. Leander Rams -ENT is consulted per Dr. Dwain Sarna -wound care consult for skin laceration -pt is Augmentin for prophylaxis  Hyperglycemia and possible new onset type 2 diabetes mellitus: blood sugar 330. Pt does not have history of diabetes, but has strong family history of diabetes.  Patient received 10 mg of Decadron in the ED, which may have contributed partially to hypoglycemia. -will start SSI -check A1c and FLP -Consult to diabetic educator -IVF: 2L NS, then 125 cc/h  Depression: -Prozac  Hypokalemia: K 3.0. -repleted with total of 80 mEQ of Kdur -check Mg level  Bradycardia: HR 40s-70.  Asymptomatic.  No chest pain or shortness of breath - Telemetry monitor  Vaping-Related Disorder: -Did counseling about importance of quitting vaping -Nicotine patch  Lactic acid acidosis: lactic acid 4.3.  Likely due to dehydration.  No signs of infection,  less likely to have sepsis. -IV fluid as above -Trend lactic acid    Thank you for this consultation.  Our [facility] hospitalist team will follow the patient with you.   Time Spent: 83  Lorretta Harp M.D. [facility] 09/10/2019, 10:55 PM

## 2019-09-10 NOTE — H&P (Signed)
Ricky Whitney is an 43 y.o. male.   Chief Complaint: facial pain HPI: 37 yom who was working on [employer] when transmission fell on right side of face.  No loc. Remembers whole event.  Called ems himself. No other issues except right facial pain.  Vision fine.  Underwent evaluation with ct scans and has multiple fx as below. Has some bradycardia that is asymptomatic in er.  I was asked to see him for admission for single system injury.  Facial trauma was called.   PMH depression PSH none meds prozac Allergies none Sh vapes   Results for orders placed or performed during the hospital encounter of 09/10/19 (from the past 48 hour(s))  CDS serology     Status: None   Collection Time: 09/10/19  4:20 PM  Result Value Ref Range   CDS serology specimen      SPECIMEN WILL BE HELD FOR 14 DAYS IF TESTING IS REQUIRED    Comment: Performed at [facility], [address], [place], [place] [address]  Comprehensive metabolic panel     Status: Abnormal   Collection Time: 09/10/19  4:20 PM  Result Value Ref Range   Sodium 135 135 - 145 mmol/L   Potassium 3.0 (L) 3.5 - 5.1 mmol/L   Chloride 98 98 - 111 mmol/L   CO2 22 22 - 32 mmol/L   Glucose, Bld 330 (H) 70 - 99 mg/dL   BUN 10 6 - 20 mg/dL   Creatinine, Ser 1.24 0.61 - 1.24 mg/dL   Calcium 9.5 8.9 - 10.3 mg/dL   Total Protein 7.3 6.5 - 8.1 g/dL   Albumin 4.4 3.5 - 5.0 g/dL   AST 30 15 - 41 U/L   ALT 49 (H) 0 - 44 U/L   Alkaline Phosphatase 102 38 - 126 U/L   Total Bilirubin 0.6 0.3 - 1.2 mg/dL   GFR calc non Af Amer >60 >60 mL/min   GFR calc Af Amer >60 >60 mL/min   Anion gap 15 5 - 15    Comment: Performed at [facility], [place] [address], [place], [place] [address]  CBC     Status: Abnormal   Collection Time: 09/10/19  4:20 PM  Result Value Ref Range   WBC 9.0 4.0 - 10.5 K/uL   RBC 5.48 4.22 - 5.81 MIL/uL   Hemoglobin 18.2 (H) 13.0 - 17.0 g/dL   HCT 49.4 39.0 - 52.0 %   MCV 90.1 80.0 - 100.0 fL   MCH 33.2 26.0 - 34.0 pg    MCHC 36.8 (H) 30.0 - 36.0 g/dL   RDW 11.1 (L) 11.5 - 15.5 %   Platelets 275 150 - 400 K/uL   nRBC 0.7 (H) 0.0 - 0.2 %    Comment: Performed at [facility] [address] [place], [place] [address]  Ethanol     Status: None   Collection Time: 09/10/19  4:20 PM  Result Value Ref Range   Alcohol, Ethyl (B) <10 <10 mg/dL    Comment: (NOTE) Lowest detectable limit for serum alcohol is 10 mg/dL. For medical purposes only. Performed at [facility], [place] [address] [place], [place] [address]   Lactic acid, plasma     Status: Abnormal   Collection Time: 09/10/19  4:20 PM  Result Value Ref Range   Lactic Acid, Venous 4.3 (HH) 0.5 - 1.9 mmol/L    Comment: CRITICAL RESULT CALLED TO, READ BACK BY AND VERIFIED WITH: REABOLB, A  RN @ 1734 ON 09/10/2019 BY Vanetta ShawlEMOCHE, H Performed at [facility], [address] [place], [place] [address]   Protime-INR     Status: None   Collection Time: 09/10/19  4:20 PM  Result Value Ref Range   Prothrombin Time 13.3 11.4 - 15.2 seconds   INR 1.0 0.8 - 1.2    Comment: (NOTE) INR goal varies based on device and disease states. Performed at [facility], [address] [place], [place] [address]   Sample to Blood Bank     Status: None   Collection Time: 09/10/19  4:20 PM  Result Value Ref Range   Blood Bank Specimen SAMPLE AVAILABLE FOR TESTING    Sample Expiration      09/11/2019,2359 Performed at [facility], [address] [place], [place] [address]   Dickie LaI-stat chem 8, ED     Status: Abnormal   Collection Time: 09/10/19  4:28 PM  Result Value Ref Range   Sodium 136 135 - 145 mmol/L   Potassium 3.2 (L) 3.5 - 5.1 mmol/L   Chloride 99 98 - 111 mmol/L   BUN 10 6 - 20 mg/dL   Creatinine, Ser 9.141.10 0.61 - 1.24 mg/dL   Glucose, Bld 782325 (H) 70 - 99 mg/dL   Calcium, Ion 9.561.09 (L) 1.15 - 1.40 mmol/L   TCO2 21 (L) 22 - 32 mmol/L   Hemoglobin 17.3 (H) 13.0 - 17.0 g/dL   HCT 21.351.0 08.639.0 - 57.852.0 %  Urinalysis, Routine w reflex  microscopic     Status: Abnormal   Collection Time: 09/10/19  7:54 PM  Result Value Ref Range   Color, Urine STRAW (A) YELLOW   APPearance CLEAR CLEAR   Specific Gravity, Urine 1.029 1.005 - 1.030   pH 6.0 5.0 - 8.0   Glucose, UA >=500 (A) NEGATIVE mg/dL   Hgb urine dipstick NEGATIVE NEGATIVE   Bilirubin Urine NEGATIVE NEGATIVE   Ketones, ur 20 (A) NEGATIVE mg/dL   Protein, ur NEGATIVE NEGATIVE mg/dL   Nitrite NEGATIVE NEGATIVE   Leukocytes,Ua NEGATIVE NEGATIVE   WBC, UA 0-5 0 - 5 WBC/hpf   Bacteria, UA NONE SEEN NONE SEEN    Comment: Performed at [facility], [address] [place], [place] [address]   Ct Head Wo Contrast  Result Date: 09/10/2019 CLINICAL DATA:  Head trauma, transmission fell on head EXAM: CT HEAD WITHOUT CONTRAST CT MAXILLOFACIAL WITHOUT CONTRAST CT CERVICAL SPINE WITHOUT CONTRAST TECHNIQUE: Multidetector CT imaging of the head, cervical spine, and maxillofacial structures were performed using the standard protocol without intravenous contrast. Multiplanar CT image reconstructions of the cervical spine and maxillofacial structures were also generated. COMPARISON:  08/23/2005 FINDINGS: CT HEAD FINDINGS Brain: No evidence of acute infarction, hemorrhage, hydrocephalus, extra-axial collection or mass lesion/mass effect. Vascular: No hyperdense vessel or unexpected calcification. CT FACIAL BONES FINDINGS Skull: Normal. Negative for fracture or focal lesion. Facial bones: There is a substantial displaced, tripod morphology fracture of the right zygoma and orbit, with comminuted and displaced fracture fragments particularly involving the orbital floor and lateral maxillary wall (series 5, image 41, 50, series 9, image 47, 42). There are additional depressed fractures of the right lamina papyracea. The superior orbit, ethmoid, and frontal sinuses appear intact as well as the hard palate and pterygoid. There are no left-sided facial fractures appreciated. Sinuses/Orbits: There  is fat herniation through the displaced right orbital floor without rectus herniation. The right globe and orbital contents are grossly intact. Other: None.  CT CERVICAL SPINE FINDINGS Alignment: Normal. Skull base and vertebrae: No acute fracture. No primary bone lesion or focal pathologic process. Soft tissues and spinal canal: No prevertebral fluid or swelling. No visible canal hematoma. Disc levels: Mild multilevel disc space height loss and osteophytosis. Upper chest: Negative. Other: None. IMPRESSION: 1.  No acute intracranial pathology. 2. There is a substantial displaced, tripod morphology fracture of the right zygoma and orbit, with comminuted and displaced fracture fragments particularly involving the orbital floor and lateral maxillary wall (series 5, image 41, 50, series 9, image 47, 42). There are additional depressed fractures of the right lamina papyracea. The superior orbit, ethmoid, and frontal sinuses appear intact as well as the hard palate and pterygoid. There are no left-sided facial fractures appreciated. 3. There is fat herniation through the displaced right orbital floor without rectus herniation. The right globe and orbital contents are grossly intact. 4.  No fracture or static subluxation of the cervical spine. Electronically Signed   By: Lauralyn Primes M.D.   On: 09/10/2019 16:58   Ct Cervical Spine Wo Contrast  Result Date: 09/10/2019 CLINICAL DATA:  Head trauma, transmission fell on head EXAM: CT HEAD WITHOUT CONTRAST CT MAXILLOFACIAL WITHOUT CONTRAST CT CERVICAL SPINE WITHOUT CONTRAST TECHNIQUE: Multidetector CT imaging of the head, cervical spine, and maxillofacial structures were performed using the standard protocol without intravenous contrast. Multiplanar CT image reconstructions of the cervical spine and maxillofacial structures were also generated. COMPARISON:  08/23/2005 FINDINGS: CT HEAD FINDINGS Brain: No evidence of acute infarction, hemorrhage, hydrocephalus, extra-axial  collection or mass lesion/mass effect. Vascular: No hyperdense vessel or unexpected calcification. CT FACIAL BONES FINDINGS Skull: Normal. Negative for fracture or focal lesion. Facial bones: There is a substantial displaced, tripod morphology fracture of the right zygoma and orbit, with comminuted and displaced fracture fragments particularly involving the orbital floor and lateral maxillary wall (series 5, image 41, 50, series 9, image 47, 42). There are additional depressed fractures of the right lamina papyracea. The superior orbit, ethmoid, and frontal sinuses appear intact as well as the hard palate and pterygoid. There are no left-sided facial fractures appreciated. Sinuses/Orbits: There is fat herniation through the displaced right orbital floor without rectus herniation. The right globe and orbital contents are grossly intact. Other: None. CT CERVICAL SPINE FINDINGS Alignment: Normal. Skull base and vertebrae: No acute fracture. No primary bone lesion or focal pathologic process. Soft tissues and spinal canal: No prevertebral fluid or swelling. No visible canal hematoma. Disc levels: Mild multilevel disc space height loss and osteophytosis. Upper chest: Negative. Other: None. IMPRESSION: 1.  No acute intracranial pathology. 2. There is a substantial displaced, tripod morphology fracture of the right zygoma and orbit, with comminuted and displaced fracture fragments particularly involving the orbital floor and lateral maxillary wall (series 5, image 41, 50, series 9, image 47, 42). There are additional depressed fractures of the right lamina papyracea. The superior orbit, ethmoid, and frontal sinuses appear intact as well as the hard palate and pterygoid. There are no left-sided facial fractures appreciated. 3. There is fat herniation through the displaced right orbital floor without rectus herniation. The right globe and orbital contents are grossly intact. 4.  No fracture or static subluxation of the  cervical spine. Electronically Signed   By: Lauralyn Primes M.D.   On: 09/10/2019 16:58   Ct 3d Recon At Scanner  Result Date: 09/10/2019 CLINICAL DATA:  Facial bone fracture EXAM: 3-DIMENSIONAL CT IMAGE RENDERING ON ACQUISITION WORKSTATION TECHNIQUE: 3-dimensional CT images were rendered  by post-processing of the original CT data on an acquisition workstation. The 3-dimensional CT images were interpreted and findings were reported in the accompanying complete CT report for this study COMPARISON:  None. FINDINGS: 3D reformatted images from maxillofacial CT were generated. Please see dedicated maxillofacial CT report for detailed fracture characterization. IMPRESSION: As above. Electronically Signed   By: Duanne GuessNicholas  Plundo M.D.   On: 09/10/2019 20:42   Dg Chest Port 1 View  Result Date: 09/10/2019 CLINICAL DATA:  Trauma EXAM: PORTABLE CHEST 1 VIEW COMPARISON:  None. FINDINGS: Lungs are clear.  No pleural effusion or pneumothorax. The heart is normal in size. IMPRESSION: No evidence of acute cardiopulmonary disease. Electronically Signed   By: Charline BillsSriyesh  Krishnan M.D.   On: 09/10/2019 16:41   Ct Maxillofacial Wo Contrast  Result Date: 09/10/2019 CLINICAL DATA:  Head trauma, transmission fell on head EXAM: CT HEAD WITHOUT CONTRAST CT MAXILLOFACIAL WITHOUT CONTRAST CT CERVICAL SPINE WITHOUT CONTRAST TECHNIQUE: Multidetector CT imaging of the head, cervical spine, and maxillofacial structures were performed using the standard protocol without intravenous contrast. Multiplanar CT image reconstructions of the cervical spine and maxillofacial structures were also generated. COMPARISON:  08/23/2005 FINDINGS: CT HEAD FINDINGS Brain: No evidence of acute infarction, hemorrhage, hydrocephalus, extra-axial collection or mass lesion/mass effect. Vascular: No hyperdense vessel or unexpected calcification. CT FACIAL BONES FINDINGS Skull: Normal. Negative for fracture or focal lesion. Facial bones: There is a substantial  displaced, tripod morphology fracture of the right zygoma and orbit, with comminuted and displaced fracture fragments particularly involving the orbital floor and lateral maxillary wall (series 5, image 41, 50, series 9, image 47, 42). There are additional depressed fractures of the right lamina papyracea. The superior orbit, ethmoid, and frontal sinuses appear intact as well as the hard palate and pterygoid. There are no left-sided facial fractures appreciated. Sinuses/Orbits: There is fat herniation through the displaced right orbital floor without rectus herniation. The right globe and orbital contents are grossly intact. Other: None. CT CERVICAL SPINE FINDINGS Alignment: Normal. Skull base and vertebrae: No acute fracture. No primary bone lesion or focal pathologic process. Soft tissues and spinal canal: No prevertebral fluid or swelling. No visible canal hematoma. Disc levels: Mild multilevel disc space height loss and osteophytosis. Upper chest: Negative. Other: None. IMPRESSION: 1.  No acute intracranial pathology. 2. There is a substantial displaced, tripod morphology fracture of the right zygoma and orbit, with comminuted and displaced fracture fragments particularly involving the orbital floor and lateral maxillary wall (series 5, image 41, 50, series 9, image 47, 42). There are additional depressed fractures of the right lamina papyracea. The superior orbit, ethmoid, and frontal sinuses appear intact as well as the hard palate and pterygoid. There are no left-sided facial fractures appreciated. 3. There is fat herniation through the displaced right orbital floor without rectus herniation. The right globe and orbital contents are grossly intact. 4.  No fracture or static subluxation of the cervical spine. Electronically Signed   By: Lauralyn PrimesAlex  Bibbey M.D.   On: 09/10/2019 16:58    Review of Systems  HENT: Negative for hearing loss.   Eyes: Negative for blurred vision, double vision, photophobia, pain,  discharge and redness.  Respiratory: Negative for cough.   Cardiovascular: Negative for chest pain.  Gastrointestinal: Negative for abdominal pain.  Musculoskeletal:       Facial pain   All other systems reviewed and are negative.   Blood pressure (!) 131/59, pulse (!) 41, resp. rate 17, height 6' (1.829 m), weight 117.9 kg,  SpO2 90 %. Physical Exam  Vitals reviewed. Constitutional: He is oriented to person, place, and time. He appears well-developed and well-nourished.  HENT:  Head: Normocephalic. Head is with abrasion.  Right Ear: External ear normal.  Left Ear: External ear normal.  Mouth/Throat: Oropharynx is clear and moist.  Right face ecchymosis and abrasion, no lacs  Eyes: Pupils are equal, round, and reactive to light. Conjunctivae and EOM are normal. No scleral icterus.  Neck: Normal range of motion. Neck supple. No tracheal deviation present.  Cardiovascular: Normal rate, regular rhythm, normal heart sounds and intact distal pulses.  Respiratory: Effort normal and breath sounds normal. He has no wheezes.  GI: Soft. There is no abdominal tenderness.  Musculoskeletal: Normal range of motion.        General: No tenderness or edema.  Lymphadenopathy:    He has no cervical adenopathy.  Neurological: He is alert and oriented to person, place, and time.  Skin: Skin is warm and dry. He is not diaphoretic.  Psychiatric: He has a normal mood and affect. His behavior is normal.     Assessment/Plan Facial crush injury from transmission Multiple facial fx- no evidence of issue with eom or right eye, facial trauma will see in the morning, I will admit him for pain control, will put on augmentin Bradycardia-er thought this possibly due to concussion but he has no TBI from this.  Not sure of why and may do this at home frequently especially when falling asleep. Will place on monitor Elevated glucose- will check A1C and start SSI, will ask medicine to see as well Lovenox,  scds  Emelia Loron, MD 09/10/2019, 9:32 PM

## 2019-09-11 DIAGNOSIS — Z20828 Contact with and (suspected) exposure to other viral communicable diseases: Secondary | ICD-10-CM | POA: Diagnosis not present

## 2019-09-11 DIAGNOSIS — R001 Bradycardia, unspecified: Secondary | ICD-10-CM | POA: Diagnosis not present

## 2019-09-11 DIAGNOSIS — F329 Major depressive disorder, single episode, unspecified: Secondary | ICD-10-CM | POA: Diagnosis not present

## 2019-09-11 DIAGNOSIS — E1165 Type 2 diabetes mellitus with hyperglycemia: Secondary | ICD-10-CM | POA: Diagnosis not present

## 2019-09-11 DIAGNOSIS — S0292XA Unspecified fracture of facial bones, initial encounter for closed fracture: Secondary | ICD-10-CM | POA: Diagnosis not present

## 2019-09-11 DIAGNOSIS — S02849A Fracture of lateral orbital wall, unspecified side, initial encounter for closed fracture: Secondary | ICD-10-CM | POA: Diagnosis not present

## 2019-09-11 DIAGNOSIS — Z8249 Family history of ischemic heart disease and other diseases of the circulatory system: Secondary | ICD-10-CM | POA: Diagnosis not present

## 2019-09-11 DIAGNOSIS — E876 Hypokalemia: Secondary | ICD-10-CM | POA: Diagnosis not present

## 2019-09-11 DIAGNOSIS — Z833 Family history of diabetes mellitus: Secondary | ICD-10-CM | POA: Diagnosis not present

## 2019-09-11 DIAGNOSIS — W208XXA Other cause of strike by thrown, projected or falling object, initial encounter: Secondary | ICD-10-CM | POA: Diagnosis not present

## 2019-09-11 DIAGNOSIS — S0240EA Zygomatic fracture, right side, initial encounter for closed fracture: Secondary | ICD-10-CM | POA: Diagnosis not present

## 2019-09-11 DIAGNOSIS — S0230XA Fracture of orbital floor, unspecified side, initial encounter for closed fracture: Secondary | ICD-10-CM | POA: Diagnosis not present

## 2019-09-11 DIAGNOSIS — Z79899 Other long term (current) drug therapy: Secondary | ICD-10-CM | POA: Diagnosis not present

## 2019-09-11 DIAGNOSIS — E669 Obesity, unspecified: Secondary | ICD-10-CM | POA: Diagnosis not present

## 2019-09-11 DIAGNOSIS — Z6835 Body mass index (BMI) 35.0-35.9, adult: Secondary | ICD-10-CM | POA: Diagnosis not present

## 2019-09-11 LAB — BASIC METABOLIC PANEL
Anion gap: 8 (ref 5–15)
BUN: 11 mg/dL (ref 6–20)
CO2: 24 mmol/L (ref 22–32)
Calcium: 8.6 mg/dL — ABNORMAL LOW (ref 8.9–10.3)
Chloride: 105 mmol/L (ref 98–111)
Creatinine, Ser: 0.98 mg/dL (ref 0.61–1.24)
GFR calc Af Amer: >60 mL/min (ref >60)
GFR calc non Af Amer: >60 mL/min (ref >60)
Glucose, Bld: 217 mg/dL — ABNORMAL HIGH (ref 70–99)
Potassium: 4.3 mmol/L (ref 3.5–5.1)
Sodium: 137 mmol/L (ref 135–145)

## 2019-09-11 LAB — CBG MONITORING, ED: Glucose-Capillary: 209 mg/dL — ABNORMAL HIGH (ref 70–99)

## 2019-09-11 LAB — MAGNESIUM: Magnesium: 2 mg/dL (ref 1.7–2.4)

## 2019-09-11 LAB — CBC
HCT: 45.6 % (ref 39.0–52.0)
Hemoglobin: 16 g/dL (ref 13.0–17.0)
MCH: 32.5 pg (ref 26.0–34.0)
MCHC: 35.1 g/dL (ref 30.0–36.0)
MCV: 92.7 fL (ref 80.0–100.0)
Platelets: 259 10*3/uL (ref 150–400)
RBC: 4.92 MIL/uL (ref 4.22–5.81)
RDW: 11.5 % (ref 11.5–15.5)
WBC: 9.4 10*3/uL (ref 4.0–10.5)
nRBC: 0 % (ref 0.0–0.2)

## 2019-09-11 LAB — LACTIC ACID, PLASMA
Lactic Acid, Venous: 0.9 mmol/L (ref 0.5–1.9)
Lactic Acid, Venous: 1.3 mmol/L (ref 0.5–1.9)

## 2019-09-11 LAB — LIPID PANEL
Cholesterol: 191 mg/dL (ref 0–200)
HDL: 30 mg/dL — ABNORMAL LOW (ref >40)
LDL Cholesterol: 131 mg/dL — ABNORMAL HIGH (ref 0–99)
Total CHOL/HDL Ratio: 6.4 RATIO
Triglycerides: 152 mg/dL — ABNORMAL HIGH (ref <150)
VLDL: 30 mg/dL (ref 0–40)

## 2019-09-11 LAB — HEMOGLOBIN A1C
Hgb A1c MFr Bld: 9.6 % — ABNORMAL HIGH (ref 4.8–5.6)
Mean Plasma Glucose: 228.82 mg/dL

## 2019-09-11 LAB — SARS CORONAVIRUS 2 (TAT 6-24 HRS): SARS Coronavirus 2: NEGATIVE

## 2019-09-11 MED ORDER — ACETAMINOPHEN 325 MG PO TABS: 650.0000 mg | ORAL_TABLET | ORAL | Status: DC | PRN

## 2019-09-11 MED ORDER — ONDANSETRON 4 MG PO TBDP: 4.0000 mg | ORAL_TABLET | Freq: Four times a day (QID) | ORAL | Status: DC | PRN

## 2019-09-11 MED ORDER — METFORMIN HCL 500 MG PO TABS: 500.0000 mg | ORAL_TABLET | Freq: Two times a day (BID) | ORAL | 11 refills | Status: DC

## 2019-09-11 MED ORDER — FREESTYLE SYSTEM KIT: 1.0000 | PACK | 1 refills | Status: AC | PRN

## 2019-09-11 MED ORDER — OXYCODONE HCL 10 MG PO TABS: 5.0000 mg | ORAL_TABLET | ORAL | 0 refills | Status: DC | PRN

## 2019-09-11 MED ORDER — ENOXAPARIN SODIUM 40 MG/0.4ML ~~LOC~~ SOLN
40.0000 mg | SUBCUTANEOUS | Status: DC
  Filled 2019-09-11: qty 0.4

## 2019-09-11 MED ORDER — AMOXICILLIN-POT CLAVULANATE 875-125 MG PO TABS: 1.0000 | ORAL_TABLET | Freq: Two times a day (BID) | ORAL | 0 refills | Status: AC

## 2019-09-11 MED ORDER — AMOXICILLIN-POT CLAVULANATE 875-125 MG PO TABS
1.0000 | ORAL_TABLET | Freq: Two times a day (BID) | ORAL | Status: DC
  Administered 2019-09-11: 1 via ORAL
  Filled 2019-09-11: qty 1

## 2019-09-11 MED ORDER — FREESTYLE SYSTEM KIT: 1.0000 | PACK | 1 refills | Status: DC | PRN

## 2019-09-11 MED ORDER — OXYCODONE HCL 5 MG PO TABS: 10.0000 mg | ORAL_TABLET | ORAL | Status: DC | PRN

## 2019-09-11 MED ORDER — HYDROMORPHONE HCL 1 MG/ML IJ SOLN: 1.0000 mg | INTRAMUSCULAR | Status: DC | PRN

## 2019-09-11 MED ORDER — INSULIN ASPART 100 UNIT/ML ~~LOC~~ SOLN: 0.0000 [IU] | Freq: Every day | SUBCUTANEOUS | Status: DC

## 2019-09-11 MED ORDER — ONDANSETRON HCL 4 MG/2ML IJ SOLN: 4.0000 mg | Freq: Four times a day (QID) | INTRAMUSCULAR | Status: DC | PRN

## 2019-09-11 MED ORDER — GLUCERNA PO LIQD: 237.0000 mL | Freq: Every day | ORAL | Status: DC

## 2019-09-11 MED ORDER — INSULIN ASPART 100 UNIT/ML ~~LOC~~ SOLN
0.0000 [IU] | Freq: Three times a day (TID) | SUBCUTANEOUS | Status: DC
  Administered 2019-09-11: 5 [IU] via SUBCUTANEOUS

## 2019-09-11 MED ORDER — OXYCODONE HCL 5 MG PO TABS: 5.0000 mg | ORAL_TABLET | ORAL | Status: DC | PRN

## 2019-09-11 NOTE — Progress Notes (Signed)
Subjective: Patient never admitted.  Wants to go home from ED.  Pain is well controlled.    Objective: Vital signs in last 24 hours: Pulse Rate:  [41-80] 60 (09/20 0800) Resp:  [10-22] 14 (09/20 0800) BP: (100-158)/(52-96) 123/61 (09/20 0800) SpO2:  [87 %-100 %] 97 % (09/20 0800) Weight:  [117.9 kg] 117.9 kg (09/19 2008)    Intake/Output from previous day: 09/19 0701 - 09/20 0700 In: 3000 [I.V.:2000; IV Piggyback:1000] Out: 400 [Urine:400] Intake/Output this shift: No intake/output data recorded.  PE: Gen: NAD HEENT: edema and ecchymosis specifically of right side of face.  Pain only with lateral movement of eye, but PERRL and EOMI Heart: regular on monitor Abd: soft, obese Psych: A&Ox3  Lab Results:  Recent Labs    09/10/19 1620 09/10/19 1628 09/11/19 0724  WBC 9.0  --  9.4  HGB 18.2* 17.3* 16.0  HCT 49.4 51.0 45.6  PLT 275  --  259   BMET Recent Labs    09/10/19 1620 09/10/19 1628  NA 135 136  K 3.0* 3.2*  CL 98 99  CO2 22  --   GLUCOSE 330* 325*  BUN 10 10  CREATININE 1.24 1.10  CALCIUM 9.5  --    PT/INR Recent Labs    09/10/19 1620  LABPROT 13.3  INR 1.0   CMP     Component Value Date/Time   NA 136 09/10/2019 1628   K 3.2 (L) 09/10/2019 1628   CL 99 09/10/2019 1628   CO2 22 09/10/2019 1620   GLUCOSE 325 (H) 09/10/2019 1628   BUN 10 09/10/2019 1628   CREATININE 1.10 09/10/2019 1628   CALCIUM 9.5 09/10/2019 1620   PROT 7.3 09/10/2019 1620   ALBUMIN 4.4 09/10/2019 1620   AST 30 09/10/2019 1620   ALT 49 (H) 09/10/2019 1620   ALKPHOS 102 09/10/2019 1620   BILITOT 0.6 09/10/2019 1620   GFRNONAA >60 09/10/2019 1620   GFRAA >60 09/10/2019 1620   Lipase  No results found for: LIPASE     Studies/Results: Ct Head Wo Contrast  Result Date: 09/10/2019 CLINICAL DATA:  Head trauma, transmission fell on head EXAM: CT HEAD WITHOUT CONTRAST CT MAXILLOFACIAL WITHOUT CONTRAST CT CERVICAL SPINE WITHOUT CONTRAST TECHNIQUE: Multidetector  CT imaging of the head, cervical spine, and maxillofacial structures were performed using the standard protocol without intravenous contrast. Multiplanar CT image reconstructions of the cervical spine and maxillofacial structures were also generated. COMPARISON:  08/23/2005 FINDINGS: CT HEAD FINDINGS Brain: No evidence of acute infarction, hemorrhage, hydrocephalus, extra-axial collection or mass lesion/mass effect. Vascular: No hyperdense vessel or unexpected calcification. CT FACIAL BONES FINDINGS Skull: Normal. Negative for fracture or focal lesion. Facial bones: There is a substantial displaced, tripod morphology fracture of the right zygoma and orbit, with comminuted and displaced fracture fragments particularly involving the orbital floor and lateral maxillary wall (series 5, image 41, 50, series 9, image 47, 42). There are additional depressed fractures of the right lamina papyracea. The superior orbit, ethmoid, and frontal sinuses appear intact as well as the hard palate and pterygoid. There are no left-sided facial fractures appreciated. Sinuses/Orbits: There is fat herniation through the displaced right orbital floor without rectus herniation. The right globe and orbital contents are grossly intact. Other: None. CT CERVICAL SPINE FINDINGS Alignment: Normal. Skull base and vertebrae: No acute fracture. No primary bone lesion or focal pathologic process. Soft tissues and spinal canal: No prevertebral fluid or swelling. No visible canal hematoma. Disc levels: Mild multilevel  disc space height loss and osteophytosis. Upper chest: Negative. Other: None. IMPRESSION: 1.  No acute intracranial pathology. 2. There is a substantial displaced, tripod morphology fracture of the right zygoma and orbit, with comminuted and displaced fracture fragments particularly involving the orbital floor and lateral maxillary wall (series 5, image 41, 50, series 9, image 47, 42). There are additional depressed fractures of the right  lamina papyracea. The superior orbit, ethmoid, and frontal sinuses appear intact as well as the hard palate and pterygoid. There are no left-sided facial fractures appreciated. 3. There is fat herniation through the displaced right orbital floor without rectus herniation. The right globe and orbital contents are grossly intact. 4.  No fracture or static subluxation of the cervical spine. Electronically Signed   By: Lauralyn PrimesAlex  Bibbey M.D.   On: 09/10/2019 16:58   Ct Cervical Spine Wo Contrast  Result Date: 09/10/2019 CLINICAL DATA:  Head trauma, transmission fell on head EXAM: CT HEAD WITHOUT CONTRAST CT MAXILLOFACIAL WITHOUT CONTRAST CT CERVICAL SPINE WITHOUT CONTRAST TECHNIQUE: Multidetector CT imaging of the head, cervical spine, and maxillofacial structures were performed using the standard protocol without intravenous contrast. Multiplanar CT image reconstructions of the cervical spine and maxillofacial structures were also generated. COMPARISON:  08/23/2005 FINDINGS: CT HEAD FINDINGS Brain: No evidence of acute infarction, hemorrhage, hydrocephalus, extra-axial collection or mass lesion/mass effect. Vascular: No hyperdense vessel or unexpected calcification. CT FACIAL BONES FINDINGS Skull: Normal. Negative for fracture or focal lesion. Facial bones: There is a substantial displaced, tripod morphology fracture of the right zygoma and orbit, with comminuted and displaced fracture fragments particularly involving the orbital floor and lateral maxillary wall (series 5, image 41, 50, series 9, image 47, 42). There are additional depressed fractures of the right lamina papyracea. The superior orbit, ethmoid, and frontal sinuses appear intact as well as the hard palate and pterygoid. There are no left-sided facial fractures appreciated. Sinuses/Orbits: There is fat herniation through the displaced right orbital floor without rectus herniation. The right globe and orbital contents are grossly intact. Other: None. CT  CERVICAL SPINE FINDINGS Alignment: Normal. Skull base and vertebrae: No acute fracture. No primary bone lesion or focal pathologic process. Soft tissues and spinal canal: No prevertebral fluid or swelling. No visible canal hematoma. Disc levels: Mild multilevel disc space height loss and osteophytosis. Upper chest: Negative. Other: None. IMPRESSION: 1.  No acute intracranial pathology. 2. There is a substantial displaced, tripod morphology fracture of the right zygoma and orbit, with comminuted and displaced fracture fragments particularly involving the orbital floor and lateral maxillary wall (series 5, image 41, 50, series 9, image 47, 42). There are additional depressed fractures of the right lamina papyracea. The superior orbit, ethmoid, and frontal sinuses appear intact as well as the hard palate and pterygoid. There are no left-sided facial fractures appreciated. 3. There is fat herniation through the displaced right orbital floor without rectus herniation. The right globe and orbital contents are grossly intact. 4.  No fracture or static subluxation of the cervical spine. Electronically Signed   By: Lauralyn PrimesAlex  Bibbey M.D.   On: 09/10/2019 16:58   Ct 3d Recon At Scanner  Result Date: 09/10/2019 CLINICAL DATA:  Facial bone fracture EXAM: 3-DIMENSIONAL CT IMAGE RENDERING ON ACQUISITION WORKSTATION TECHNIQUE: 3-dimensional CT images were rendered by post-processing of the original CT data on an acquisition workstation. The 3-dimensional CT images were interpreted and findings were reported in the accompanying complete CT report for this study COMPARISON:  None. FINDINGS: 3D reformatted images from maxillofacial  CT were generated. Please see dedicated maxillofacial CT report for detailed fracture characterization. IMPRESSION: As above. Electronically Signed   By: Davina Poke M.D.   On: 09/10/2019 20:42   Dg Chest Port 1 View  Result Date: 09/10/2019 CLINICAL DATA:  Trauma EXAM: PORTABLE CHEST 1 VIEW  COMPARISON:  None. FINDINGS: Lungs are clear.  No pleural effusion or pneumothorax. The heart is normal in size. IMPRESSION: No evidence of acute cardiopulmonary disease. Electronically Signed   By: Julian Hy M.D.   On: 09/10/2019 16:41   Ct Maxillofacial Wo Contrast  Result Date: 09/10/2019 CLINICAL DATA:  Head trauma, transmission fell on head EXAM: CT HEAD WITHOUT CONTRAST CT MAXILLOFACIAL WITHOUT CONTRAST CT CERVICAL SPINE WITHOUT CONTRAST TECHNIQUE: Multidetector CT imaging of the head, cervical spine, and maxillofacial structures were performed using the standard protocol without intravenous contrast. Multiplanar CT image reconstructions of the cervical spine and maxillofacial structures were also generated. COMPARISON:  08/23/2005 FINDINGS: CT HEAD FINDINGS Brain: No evidence of acute infarction, hemorrhage, hydrocephalus, extra-axial collection or mass lesion/mass effect. Vascular: No hyperdense vessel or unexpected calcification. CT FACIAL BONES FINDINGS Skull: Normal. Negative for fracture or focal lesion. Facial bones: There is a substantial displaced, tripod morphology fracture of the right zygoma and orbit, with comminuted and displaced fracture fragments particularly involving the orbital floor and lateral maxillary wall (series 5, image 41, 50, series 9, image 47, 42). There are additional depressed fractures of the right lamina papyracea. The superior orbit, ethmoid, and frontal sinuses appear intact as well as the hard palate and pterygoid. There are no left-sided facial fractures appreciated. Sinuses/Orbits: There is fat herniation through the displaced right orbital floor without rectus herniation. The right globe and orbital contents are grossly intact. Other: None. CT CERVICAL SPINE FINDINGS Alignment: Normal. Skull base and vertebrae: No acute fracture. No primary bone lesion or focal pathologic process. Soft tissues and spinal canal: No prevertebral fluid or swelling. No visible  canal hematoma. Disc levels: Mild multilevel disc space height loss and osteophytosis. Upper chest: Negative. Other: None. IMPRESSION: 1.  No acute intracranial pathology. 2. There is a substantial displaced, tripod morphology fracture of the right zygoma and orbit, with comminuted and displaced fracture fragments particularly involving the orbital floor and lateral maxillary wall (series 5, image 41, 50, series 9, image 47, 42). There are additional depressed fractures of the right lamina papyracea. The superior orbit, ethmoid, and frontal sinuses appear intact as well as the hard palate and pterygoid. There are no left-sided facial fractures appreciated. 3. There is fat herniation through the displaced right orbital floor without rectus herniation. The right globe and orbital contents are grossly intact. 4.  No fracture or static subluxation of the cervical spine. Electronically Signed   By: Eddie Candle M.D.   On: 09/10/2019 16:58    Anti-infectives: Anti-infectives (From admission, onward)   Start     Dose/Rate Route Frequency Ordered Stop   09/11/19 1000  amoxicillin-clavulanate (AUGMENTIN) 875-125 MG per tablet 1 tablet     1 tablet Oral Every 12 hours 09/11/19 0717     09/11/19 0000  amoxicillin-clavulanate (AUGMENTIN) 875-125 MG tablet     1 tablet Oral Every 12 hours 09/11/19 0828 09/16/19 2359   09/10/19 2030  amoxicillin-clavulanate (AUGMENTIN) 875-125 MG per tablet 1 tablet     1 tablet Oral  Once 09/10/19 2021 09/10/19 2109       Assessment/Plan Facial crush injury from transmission Multiple facial fx- no evidence of issue with eom or right eye,  will see facial trauma as outpatient.  5 days of augmentin for prophylaxis.  Precautions given to him regarding facial fractures and restrictions Bradycardia- follow up with PCP Elevated glucose- follow up with PCP  Patient wants to go home.  His pain is well controlled.  He can be discharged from the ED.  Follow up has been arranged.  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   LOS: 0 days    Letha CapeKelly E Addelyn Alleman , [facility] 09/11/2019, 8:31 AM Pager: [phone]

## 2019-09-11 NOTE — Discharge Instructions (Signed)
Diabetes Basics  Diabetes (diabetes mellitus) is a long-term (chronic) disease. It occurs when the body does not properly use sugar (glucose) that is released from food after you eat. Diabetes may be caused by one or both of these problems:  Your pancreas does not make enough of a hormone called insulin.  Your body does not react in a normal way to insulin that it makes. Insulin lets sugars (glucose) go into cells in your body. This gives you energy. If you have diabetes, sugars cannot get into cells. This causes high blood sugar (hyperglycemia). Follow these instructions at home: How is diabetes treated? You may need to take insulin or other diabetes medicines daily to keep your blood sugar in balance. Take your diabetes medicines every day as told by your doctor. List your diabetes medicines here: Diabetes medicines  Name of medicine: ______________________________ ? Amount (dose): _______________ Time (a.m./p.m.): _______________ Notes: ___________________________________  Name of medicine: ______________________________ ? Amount (dose): _______________ Time (a.m./p.m.): _______________ Notes: ___________________________________  Name of medicine: ______________________________ ? Amount (dose): _______________ Time (a.m./p.m.): _______________ Notes: ___________________________________ If you use insulin, you will learn how to give yourself insulin by injection. You may need to adjust the amount based on the food that you eat. List the types of insulin you use here: Insulin  Insulin type: ______________________________ ? Amount (dose): _______________ Time (a.m./p.m.): _______________ Notes: ___________________________________  Insulin type: ______________________________ ? Amount (dose): _______________ Time (a.m./p.m.): _______________ Notes: ___________________________________  Insulin type: ______________________________ ? Amount (dose): _______________ Time (a.m./p.m.):  _______________ Notes: ___________________________________  Insulin type: ______________________________ ? Amount (dose): _______________ Time (a.m./p.m.): _______________ Notes: ___________________________________  Insulin type: ______________________________ ? Amount (dose): _______________ Time (a.m./p.m.): _______________ Notes: ___________________________________ How do I manage my blood sugar?  Check your blood sugar levels using a blood glucose monitor as directed by your doctor. Your doctor will set treatment goals for you. Generally, you should have these blood sugar levels:  Before meals (preprandial): 80-130 mg/dL (4.4-7.2 mmol/L).  After meals (postprandial): below 180 mg/dL (10 mmol/L).  A1c level: less than 7%. Write down the times that you will check your blood sugar levels: Blood sugar checks  Time: _______________ Notes: ___________________________________  Time: _______________ Notes: ___________________________________  Time: _______________ Notes: ___________________________________  Time: _______________ Notes: ___________________________________  Time: _______________ Notes: ___________________________________  Time: _______________ Notes: ___________________________________  What do I need to know about low blood sugar? Low blood sugar is called hypoglycemia. This is when blood sugar is at or below 70 mg/dL (3.9 mmol/L). Symptoms may include:  Feeling: ? Hungry. ? Worried or nervous (anxious). ? Sweaty and clammy. ? Confused. ? Dizzy. ? Sleepy. ? Sick to your stomach (nauseous).  Having: ? A fast heartbeat. ? A headache. ? A change in your vision. ? Tingling or no feeling (numbness) around the mouth, lips, or tongue. ? Jerky movements that you cannot control (seizure).  Having trouble with: ? Moving (coordination). ? Sleeping. ? Passing out (fainting). ? Getting upset easily (irritability). Treating low blood sugar To treat low blood  sugar, eat or drink something sugary right away. If you can think clearly and swallow safely, follow the 15:15 rule:  Take 15 grams of a fast-acting carb (carbohydrate). Talk with your doctor about how much you should take.  Some fast-acting carbs are: ? Sugar tablets (glucose pills). Take 3-4 glucose pills. ? 6-8 pieces of hard candy. ? 4-6 oz (120-150 mL) of fruit juice. ? 4-6 oz (120-150 mL) of regular (not diet) soda. ? 1 Tbsp (15 mL) honey or sugar.  Check your blood sugar 15 minutes after you take the carb.  If your blood sugar is still at or below 70 mg/dL (3.9 mmol/L), take 15 grams of a carb again.  If your blood sugar does not go above 70 mg/dL (3.9 mmol/L) after 3 tries, get help right away.  After your blood sugar goes back to normal, eat a meal or a snack within 1 hour. Treating very low blood sugar If your blood sugar is at or below 54 mg/dL (3 mmol/L), you have very low blood sugar (severe hypoglycemia). This is an emergency. Do not wait to see if the symptoms will go away. Get medical help right away. Call your local emergency services (911 in the [place] Do not drive yourself to the hospital. Questions to ask your health care provider  Do I need to meet with a diabetes educator?  What equipment will I need to care for myself at home?  What diabetes medicines do I need? When should I take them?  How often do I need to check my blood sugar?  What number can I call if I have questions?  When is my next doctor's visit?  Where can I find a support group for people with diabetes? Where to find more information  American Diabetes Association: www.diabetes.org  American Association of Diabetes Educators: www.diabeteseducator.org/patient-resources Contact a doctor if:  Your blood sugar is at or above 240 mg/dL (99.2 mmol/L) for 2 days in a row.  You have been sick or have had a fever for 2 days or more, and you are not getting better.  You have any of these  problems for more than 6 hours: ? You cannot eat or drink. ? You feel sick to your stomach (nauseous). ? You throw up (vomit). ? You have watery poop (diarrhea). Get help right away if:  Your blood sugar is lower than 54 mg/dL (3 mmol/L).  You get confused.  You have trouble: ? Thinking clearly. ? Breathing. Summary  Diabetes (diabetes mellitus) is a long-term (chronic) disease. It occurs when the body does not properly use sugar (glucose) that is released from food after digestion.  Take insulin and diabetes medicines as told.  Check your blood sugar every day, as often as told.  Keep all follow-up visits as told by your doctor. This is important. This information is not intended to replace advice given to you by your health care provider. Make sure you discuss any questions you have with your health care provider. Document Released: 03/12/2018 Document Revised: 01/28/2019 Document Reviewed: 03/12/2018 Elsevier Patient Education  2020 [employer].   Diabetes Mellitus and Nutrition, Adult When you have diabetes (diabetes mellitus), it is very important to have healthy eating habits because your blood sugar (glucose) levels are greatly affected by what you eat and drink. Eating healthy foods in the appropriate amounts, at about the same times every day, can help you:  Control your blood glucose.  Lower your risk of heart disease.  Improve your blood pressure.  Reach or maintain a healthy weight. Every person with diabetes is different, and each person has different needs for a meal plan. Your health care provider may recommend that you work with a diet and nutrition specialist (dietitian) to make a meal plan that is best for you. Your meal plan may vary depending on factors such as:  The calories you need.  The medicines you take.  Your weight.  Your blood glucose, blood pressure, and cholesterol levels.  Your activity level.  Other health conditions you have, such  as heart or kidney disease. How do carbohydrates affect me? Carbohydrates, also called carbs, affect your blood glucose level more than any other type of food. Eating carbs naturally raises the amount of glucose in your blood. Carb counting is a method for keeping track of how many carbs you eat. Counting carbs is important to keep your blood glucose at a healthy level, especially if you use insulin or take certain oral diabetes medicines. It is important to know how many carbs you can safely have in each meal. This is different for every person. Your dietitian can help you calculate how many carbs you should have at each meal and for each snack. Foods that contain carbs include:  Bread, cereal, rice, pasta, and crackers.  Potatoes and corn.  Peas, beans, and lentils.  Milk and yogurt.  Fruit and juice.  Desserts, such as cakes, cookies, ice cream, and candy. How does alcohol affect me? Alcohol can cause a sudden decrease in blood glucose (hypoglycemia), especially if you use insulin or take certain oral diabetes medicines. Hypoglycemia can be a life-threatening condition. Symptoms of hypoglycemia (sleepiness, dizziness, and confusion) are similar to symptoms of having too much alcohol. If your health care provider says that alcohol is safe for you, follow these guidelines:  Limit alcohol intake to no more than 1 drink per day for nonpregnant women and 2 drinks per day for men. One drink equals 12 oz of beer, 5 oz of wine, or 1 oz of hard liquor.  Do not drink on an empty stomach.  Keep yourself hydrated with water, diet soda, or unsweetened iced tea.  Keep in mind that regular soda, juice, and other mixers may contain a lot of sugar and must be counted as carbs. What are tips for following this plan?  Reading food labels  Start by checking the serving size on the "Nutrition Facts" label of packaged foods and drinks. The amount of calories, carbs, fats, and other nutrients listed on  the label is based on one serving of the item. Many items contain more than one serving per package.  Check the total grams (g) of carbs in one serving. You can calculate the number of servings of carbs in one serving by dividing the total carbs by 15. For example, if a food has 30 g of total carbs, it would be equal to 2 servings of carbs.  Check the number of grams (g) of saturated and trans fats in one serving. Choose foods that have low or no amount of these fats.  Check the number of milligrams (mg) of salt (sodium) in one serving. Most people should limit total sodium intake to less than 2,300 mg per day.  Always check the nutrition information of foods labeled as "low-fat" or "nonfat". These foods may be higher in added sugar or refined carbs and should be avoided.  Talk to your dietitian to identify your daily goals for nutrients listed on the label. Shopping  Avoid buying canned, premade, or processed foods. These foods tend to be high in fat, sodium, and added sugar.  Shop around the outside edge of the grocery store. This includes fresh fruits and vegetables, bulk grains, fresh meats, and fresh dairy. Cooking  Use low-heat cooking methods, such as baking, instead of high-heat cooking methods like deep frying.  Cook using healthy oils, such as olive, canola, or sunflower oil.  Avoid cooking with butter, cream, or high-fat meats. Meal planning  Eat meals and  snacks regularly, preferably at the same times every day. Avoid going long periods of time without eating.  Eat foods high in fiber, such as fresh fruits, vegetables, beans, and whole grains. Talk to your dietitian about how many servings of carbs you can eat at each meal.  Eat 4-6 ounces (oz) of lean protein each day, such as lean meat, chicken, fish, eggs, or tofu. One oz of lean protein is equal to: ? 1 oz of meat, chicken, or fish. ? 1 egg. ?  cup of tofu.  Eat some foods each day that contain healthy fats, such as  avocado, nuts, seeds, and fish. Lifestyle  Check your blood glucose regularly.  Exercise regularly as told by your health care provider. This may include: ? 150 minutes of moderate-intensity or vigorous-intensity exercise each week. This could be brisk walking, biking, or water aerobics. ? Stretching and doing strength exercises, such as yoga or weightlifting, at least 2 times a week.  Take medicines as told by your health care provider.  Do not use any products that contain nicotine or tobacco, such as cigarettes and e-cigarettes. If you need help quitting, ask your health care provider.  Work with a Veterinary surgeoncounselor or diabetes educator to identify strategies to manage stress and any emotional and social challenges. Questions to ask a health care provider  Do I need to meet with a diabetes educator?  Do I need to meet with a dietitian?  What number can I call if I have questions?  When are the best times to check my blood glucose? Where to find more information:  American Diabetes Association: diabetes.org  Academy of Nutrition and Dietetics: www.eatright.[employer]  National Institute of Diabetes and Digestive and Kidney Diseases (NIH): [employer].tnwww.niddk.nih.gov Summary  A healthy meal plan will help you control your blood glucose and maintain a healthy lifestyle.  Working with a diet and nutrition specialist (dietitian) can help you make a meal plan that is best for you.  Keep in mind that carbohydrates (carbs) and alcohol have immediate effects on your blood glucose levels. It is important to count carbs and to use alcohol carefully. This information is not intended to replace advice given to you by your health care provider. Make sure you discuss any questions you have with your health care provider. Document Released: 09/04/2005 Document Revised: 11/20/2017 Document Reviewed: 01/12/2017 Elsevier Patient Education  2020 Elsevier Inc.  Orbital Floor Fracture With Entrapment The orbital  floor is the bottom wall of the eye socket (orbit). An orbital floor fracture with entrapment is a break in the orbital floor that allows nearby muscle, tissue, or both to get trapped inside of the fracture. This usually causes swelling and pain, and often affects (impairs) vision. Treatment depends on how severe your entrapment is, and how old you are. In most cases, you will take medicine until your swelling goes down. Then your health care provider will evaluate whether you need more treatment, such as surgery. Children can get a type of fracture that requires surgical treatment sooner (trapdoor fracture). Orbital fractures with entrapment are often more serious in children than in adults. What are the causes? This condition is caused by an injury to the eye, such as a hard, direct hit. What are the signs or symptoms? Symptoms of this condition include:  Swelling and bruising around the eye, causing a "black eye" appearance.  A crunching sound when pressure is placed on or around the eye area, such as when the eyelid is touched.  Seeing two  of everything ("double vision"), with one object appearing higher than the other (vertical diplopia). This may get worse when you look up.  Inability to move the eye or look in a certain direction (palsy).  Pain around the eye when you look up.  One eye looking sunken compared to the other eye (enophthalmos).  Numbness of the cheek, inner nose, and upper gum on the same side of the face as the injury. How is this diagnosed? This condition may be diagnosed based on:  Your symptoms and medical history.  An eye exam.  An X-ray or CT scan. How is this treated? This condition may first be treated with medicines, such as one or more of the following:  Antibiotics to treat or prevent infection.  Steroids to reduce swelling.  Decongestants to help relieve sinus stuffiness (congestion).  Medicines to reduce pain. The fracture itself is usually not  treated until all the swelling around the eye has gone away. This may take 1-2 weeks. After your swelling goes away:  If you have persistent vertical double vision, an eye specialist (ophthalmologist) may try to free the entrapped tissue. If this is not possible, you may need surgery.  If you have double vision only when looking up, your health care provider will discuss treatment options with you. Some people who do not spend a lot of time looking up choose not to have more treatment. Others who need to look up often, such as electricians, need treatment. You may need surgery sooner than later if you have:  A trapdoor fracture.  Symptoms of a nervous system response (oculocardiac reflex) such as: ? Nausea or vomiting. ? A slow heart rate. ? Dizziness. ? Loss of consciousness. Follow these instructions at home: Safety  Do not drive or perform your regular activities without your health care provider's approval. Be aware that if you are only using one eye to see, you may have difficulty judging distances (depth perception). Eye care  Always follow recommendations about wearing protective glasses or goggles.  Do not wear contact lenses until your health care provider says it is okay. Managing pain and swelling   If directed, put ice over your eye area to help reduce swelling: ? Put ice in a plastic bag. ? Place a towel between your skin and the bag. ? Leave the ice on for 20 minutes, 2-3 times a day.  If recommended by your health care provider, sleep with one or two extra pillows under your head. Keeping your head raised slightly when lying down can help with swelling and pain. Medicines  Take over-the-counter and prescription medicines only as told by your health care provider.  If you were prescribed an antibiotic medicine, take it as told by your health care provider. Do not stop taking the antibiotic even if you start to feel better. General instructions  Do not touch, rub,  or try to move your eye.  Do not blow your nose until your health care provider says it is okay.  Stay away from dusty areas.  Avoid traveling by plane or going to high-altitude areas until you recover. These activities may slow the healing of your swelling and may increase sinus pain.  Keep all follow-up visits as told by your health care provider. This is important. Contact a health care provider if you:  Have vision changes, such as double vision that gets worse.  Notice that the redness or swelling around your eye gets worse instead of better.  Notice blood or fluid  starting to leak from your nose.  Have a fever. Get help right away if you:  Have a sensation that you are seeing flashing lights.  Have sudden blindness.  Have nausea or vomiting.  Notice that your heart is beating much slower than normal.  Have chest pain.  Are light-headed.  Are short of breath. Summary  An orbital floor fracture with entrapment is a break in the orbital floor that allows nearby muscle, tissue, or both to get trapped inside of the fracture.  This condition may cause you to see two of everything, with one object appearing higher than the other (vertical diplopia).  The fracture itself is usually not treated until all the swelling around the eye has gone away. This may take 1-2 weeks.  You should not drive or perform your regular activities without your health care provider's approval. This information is not intended to replace advice given to you by your health care provider. Make sure you discuss any questions you have with your health care provider. Document Released: 06/03/2001 Document Revised: 02/19/2018 Document Reviewed: 10/22/2017 Elsevier Patient Education  2020 Elsevier Inc.   Zygoma Fracture  A zygoma fracture is a break in one of the bones in the face. The zygoma forms the part of your cheekbone that you can feel under your eye. The main part of the zygoma meets the  bone that forms the middle part of your face (maxillary bone) under your eye. The zygoma also has an arched part that extends along the side of your face to meet the bone that forms the side of your head (temporalbone).  A zygoma fracture may involve the main part of the bone, the arch of the bone, or both parts of the bone. What are the causes? This condition may be caused by an injury or trauma to the face, which can happen from:  A car accident.  A direct blow to the face.  A sports injury.  A fall. What increases the risk? You are more likely to develop this condition if:  You play contact sports.  You are a victim of violence or participate in violent activities or behaviors. What are the signs or symptoms? Symptoms of this condition include:  Swelling.  Bruising.  Pain.  Difficulty or pain when chewing.  A feeling that your teeth are out of line. As the swelling goes down, your face may look different because your cheekbone is flat or set back (depressed). If the fracture extends into bones that support your eye (blowout fracture), you may have double vision or numbness in your cheek. How is this diagnosed? This condition may be diagnosed based on:  Your symptoms and description of the injury.  A physical exam. Your health care provider will check your cheekbone area and feel whether your zygoma is depressed or separated.  Tests to confirm the diagnosis and check for other injuries. These tests may include: ? X-rays. ? CT scan. ? Eye exam. How is this treated? Treatment depends on the type of fracture you have and how severe it is. You may have to wait for treatment until the swelling and inflammation decrease. This injury may be treated with:  Rest. If you have a fracture that does not cause any deformity or change in your chewing (non-displaced fracture), you may not need treatment.  Taking medicine for pain.  Surgery. You may need surgery if you have trouble  opening your mouth or if you have a cheekbone deformity (displaced fracture). Surgery may  involve either of the following: ? A closed reduction. A small incision is made inside your mouth or on the side of your head. The surgeon inserts a smooth, blunt surgical instrument through the incision to lift the bone back into proper position. ? An open reduction. This may require a small incision over the fracture site. The fracture is put back into proper position. It will be held in place with wires or with screws and metal plates. Follow these instructions at home: Activity  Rest as told by your health care provider.  Avoid lying down on your injured side. It may help to sleep in a sitting position. Try sleeping in a reclining chair or propping yourself up with extra pillows in bed.  Do not participate in activities that put you at risk for injuring the area again.  Wear protective gear as told by your health care provider, especially when participating in sports or activities that put you at risk for re-injury. Medicines  Take over-the-counter and prescription medicines only as told by your health care provider.  Do not drive or use heavy machinery while taking prescription pain medicine.  If you are taking prescription pain medicine, take actions to prevent or treat constipation. Your health care provider may recommend that you: ? Drink enough fluid to keep your urine pale yellow. ? Eat foods that are high in fiber, such as fresh fruits and vegetables, whole grains, and beans. ? Limit foods that are high in fat and processed sugars, such as fried or sweet foods. ? Take an over-the-counter or prescription medicine for constipation. General instructions   Avoid blowing your nose.  Wash and dry your face gently.  Do not use any products that contain nicotine or tobacco, such as cigarettes and e-cigarettes. These can delay bone healing. If you need help quitting, ask your health care  provider.  Follow instructions from your health care provider about eating or drinking restrictions. Eat a soft or liquid diet until your health care provider says it is okay for you to chew.  If directed, put ice on the injured area. ? Put ice in a plastic bag. ? Place a towel between your skin and the bag. ? Leave the ice on for 20 minutes, 2-3 times a day.  Keep all follow-up visits as told by your health care provider. This is important. Contact a health care provider if:  Pain or inflammation does not decrease with medicines.  Swelling or bruising of the injured area gets worse.  You develop any vision problems.  You have a lot of clear watery discharge from your nose.  You have a fever.  You have nausea or vomiting. Get help right away if you:  Have increased trouble moving your mouth.  Have trouble breathing or swallowing.  Develop a severe headache. Summary  A zygoma fracture is a break in one of the bones in the face. The zygoma forms the part of your cheekbone that you can feel under your eye.  This condition may be caused by an injury or trauma to the face.  Avoid blowing your nose and lying down on your injured side.  Treatment depends on the type of fracture you have and how severe it is. You may have to wait for treatment until the swelling and inflammation decrease. This information is not intended to replace advice given to you by your health care provider. Make sure you discuss any questions you have with your health care provider. Document Released: 09/02/2001 Document  Revised: 03/02/2018 Document Reviewed: 11/25/2017 Elsevier Patient Education  2020 [employer].

## 2019-09-11 NOTE — ED Notes (Signed)
Pt verbalized understanding of discharge paperwork, prescriptions and follow-up appt.

## 2019-09-12 ENCOUNTER — Encounter: Payer: Self-pay | Admitting: Family Medicine

## 2019-09-13 DIAGNOSIS — S0240EB Zygomatic fracture, right side, initial encounter for open fracture: Secondary | ICD-10-CM | POA: Diagnosis not present

## 2019-09-14 ENCOUNTER — Encounter (HOSPITAL_BASED_OUTPATIENT_CLINIC_OR_DEPARTMENT_OTHER): Payer: Self-pay | Admitting: *Deleted

## 2019-09-14 ENCOUNTER — Ambulatory Visit: Payer: Self-pay | Admitting: Oral Surgery

## 2019-09-14 ENCOUNTER — Other Ambulatory Visit: Payer: Self-pay

## 2019-09-17 ENCOUNTER — Other Ambulatory Visit (HOSPITAL_COMMUNITY)
Admission: RE | Admit: 2019-09-17 | Discharge: 2019-09-17 | Disposition: A | Discharge status: Home / Self Care | Payer: BC Managed Care – PPO | Source: Ambulatory Visit | Attending: Oral Surgery | Admitting: Oral Surgery

## 2019-09-17 DIAGNOSIS — Z20828 Contact with and (suspected) exposure to other viral communicable diseases: Secondary | ICD-10-CM | POA: Diagnosis not present

## 2019-09-17 DIAGNOSIS — Z01812 Encounter for preprocedural laboratory examination: Secondary | ICD-10-CM | POA: Insufficient documentation

## 2019-09-18 LAB — NOVEL CORONAVIRUS, NAA (HOSP ORDER, SEND-OUT TO REF LAB; TAT 18-24 HRS): SARS-CoV-2, NAA: NOT DETECTED

## 2019-09-21 ENCOUNTER — Encounter (HOSPITAL_BASED_OUTPATIENT_CLINIC_OR_DEPARTMENT_OTHER)
Admission: RE | Disposition: A | Discharge status: Home / Self Care | Payer: Self-pay | Source: Home / Self Care | Attending: Oral Surgery

## 2019-09-21 ENCOUNTER — Ambulatory Visit (HOSPITAL_BASED_OUTPATIENT_CLINIC_OR_DEPARTMENT_OTHER)
Admission: RE | Admit: 2019-09-21 | Discharge: 2019-09-21 | Disposition: A | Discharge status: Home / Self Care | Payer: BC Managed Care – PPO | Attending: Oral Surgery | Admitting: Oral Surgery

## 2019-09-21 ENCOUNTER — Ambulatory Visit (HOSPITAL_BASED_OUTPATIENT_CLINIC_OR_DEPARTMENT_OTHER): Payer: BC Managed Care – PPO | Admitting: Certified Registered"

## 2019-09-21 ENCOUNTER — Encounter (HOSPITAL_BASED_OUTPATIENT_CLINIC_OR_DEPARTMENT_OTHER): Payer: Self-pay | Admitting: *Deleted

## 2019-09-21 ENCOUNTER — Other Ambulatory Visit: Payer: Self-pay

## 2019-09-21 DIAGNOSIS — Z6837 Body mass index (BMI) 37.0-37.9, adult: Secondary | ICD-10-CM | POA: Diagnosis not present

## 2019-09-21 DIAGNOSIS — F418 Other specified anxiety disorders: Secondary | ICD-10-CM | POA: Diagnosis not present

## 2019-09-21 DIAGNOSIS — S0240EA Zygomatic fracture, right side, initial encounter for closed fracture: Secondary | ICD-10-CM | POA: Diagnosis not present

## 2019-09-21 DIAGNOSIS — W230XXA Caught, crushed, jammed, or pinched between moving objects, initial encounter: Secondary | ICD-10-CM | POA: Insufficient documentation

## 2019-09-21 DIAGNOSIS — F329 Major depressive disorder, single episode, unspecified: Secondary | ICD-10-CM | POA: Insufficient documentation

## 2019-09-21 DIAGNOSIS — E876 Hypokalemia: Secondary | ICD-10-CM | POA: Diagnosis not present

## 2019-09-21 DIAGNOSIS — F419 Anxiety disorder, unspecified: Secondary | ICD-10-CM | POA: Diagnosis not present

## 2019-09-21 DIAGNOSIS — R001 Bradycardia, unspecified: Secondary | ICD-10-CM | POA: Insufficient documentation

## 2019-09-21 DIAGNOSIS — E119 Type 2 diabetes mellitus without complications: Secondary | ICD-10-CM | POA: Diagnosis not present

## 2019-09-21 DIAGNOSIS — R51 Headache: Secondary | ICD-10-CM | POA: Diagnosis not present

## 2019-09-21 DIAGNOSIS — Y9389 Activity, other specified: Secondary | ICD-10-CM | POA: Insufficient documentation

## 2019-09-21 DIAGNOSIS — F17290 Nicotine dependence, other tobacco product, uncomplicated: Secondary | ICD-10-CM | POA: Diagnosis not present

## 2019-09-21 DIAGNOSIS — Z79899 Other long term (current) drug therapy: Secondary | ICD-10-CM | POA: Diagnosis not present

## 2019-09-21 DIAGNOSIS — S0240EB Zygomatic fracture, right side, initial encounter for open fracture: Secondary | ICD-10-CM | POA: Diagnosis not present

## 2019-09-21 DIAGNOSIS — E669 Obesity, unspecified: Secondary | ICD-10-CM | POA: Insufficient documentation

## 2019-09-21 HISTORY — DX: Type 2 diabetes mellitus without complications: E11.9

## 2019-09-21 HISTORY — PX: ORIF ORBITAL FRACTURE: SHX5312

## 2019-09-21 LAB — GLUCOSE, CAPILLARY
Glucose-Capillary: 157 mg/dL — ABNORMAL HIGH (ref 70–99)
Glucose-Capillary: 217 mg/dL — ABNORMAL HIGH (ref 70–99)

## 2019-09-21 SURGERY — OPEN REDUCTION INTERNAL FIXATION (ORIF) ORBITAL FRACTURE
Anesthesia: General | Site: Face | Laterality: Right

## 2019-09-21 MED ORDER — ROCURONIUM BROMIDE 10 MG/ML (PF) SYRINGE
PREFILLED_SYRINGE | INTRAVENOUS | Status: AC
  Filled 2019-09-21: qty 10

## 2019-09-21 MED ORDER — CHLORHEXIDINE GLUCONATE 0.12 % MT SOLN: 15.0000 mL | Freq: Three times a day (TID) | OROMUCOSAL | 0 refills | Status: DC

## 2019-09-21 MED ORDER — LIDOCAINE HCL (CARDIAC) PF 100 MG/5ML IV SOSY
PREFILLED_SYRINGE | INTRAVENOUS | Status: DC | PRN
  Administered 2019-09-21: 100 mg via INTRAVENOUS

## 2019-09-21 MED ORDER — FENTANYL CITRATE (PF) 100 MCG/2ML IJ SOLN
50.0000 ug | INTRAMUSCULAR | Status: AC | PRN
  Administered 2019-09-21 (×2): 50 ug via INTRAVENOUS
  Administered 2019-09-21: 13:00:00 100 ug via INTRAVENOUS
  Administered 2019-09-21: 50 ug via INTRAVENOUS

## 2019-09-21 MED ORDER — AMOXICILLIN-POT CLAVULANATE 875-125 MG PO TABS: 1.0000 | ORAL_TABLET | Freq: Two times a day (BID) | ORAL | 0 refills | Status: AC

## 2019-09-21 MED ORDER — DEXTROSE 5 % IV SOLN
3.0000 g | INTRAVENOUS | Status: AC
  Administered 2019-09-21: 13:00:00 3 g via INTRAVENOUS

## 2019-09-21 MED ORDER — GLYCOPYRROLATE PF 0.2 MG/ML IJ SOSY
PREFILLED_SYRINGE | INTRAMUSCULAR | Status: AC
  Filled 2019-09-21: qty 1

## 2019-09-21 MED ORDER — ONDANSETRON HCL 4 MG/2ML IJ SOLN
INTRAMUSCULAR | Status: AC
  Filled 2019-09-21: qty 2

## 2019-09-21 MED ORDER — FENTANYL CITRATE (PF) 100 MCG/2ML IJ SOLN
INTRAMUSCULAR | Status: AC
  Filled 2019-09-21: qty 2

## 2019-09-21 MED ORDER — DEXAMETHASONE SODIUM PHOSPHATE 10 MG/ML IJ SOLN
INTRAMUSCULAR | Status: AC
  Filled 2019-09-21: qty 1

## 2019-09-21 MED ORDER — ONDANSETRON HCL 4 MG/2ML IJ SOLN: 4.0000 mg | Freq: Once | INTRAMUSCULAR | Status: DC | PRN

## 2019-09-21 MED ORDER — MIDAZOLAM HCL 2 MG/2ML IJ SOLN
INTRAMUSCULAR | Status: AC
  Filled 2019-09-21: qty 2

## 2019-09-21 MED ORDER — NEOMYCIN-POLYMYXIN-DEXAMETH 3.5-10000-0.1 OP OINT
TOPICAL_OINTMENT | OPHTHALMIC | Status: AC
  Filled 2019-09-21: qty 3.5

## 2019-09-21 MED ORDER — DEXAMETHASONE SODIUM PHOSPHATE 10 MG/ML IJ SOLN
INTRAMUSCULAR | Status: DC | PRN
  Administered 2019-09-21: 10 mg via INTRAVENOUS

## 2019-09-21 MED ORDER — HYDROMORPHONE HCL 1 MG/ML IJ SOLN
INTRAMUSCULAR | Status: AC
  Filled 2019-09-21: qty 0.5

## 2019-09-21 MED ORDER — MIDAZOLAM HCL 2 MG/2ML IJ SOLN
1.0000 mg | INTRAMUSCULAR | Status: DC | PRN
  Administered 2019-09-21: 2 mg via INTRAVENOUS

## 2019-09-21 MED ORDER — LIDOCAINE-EPINEPHRINE 2 %-1:100000 IJ SOLN
INTRAMUSCULAR | Status: DC | PRN
  Administered 2019-09-21: 17 mL via INTRADERMAL

## 2019-09-21 MED ORDER — LACTATED RINGERS IV SOLN
INTRAVENOUS | Status: DC
  Administered 2019-09-21 (×3): via INTRAVENOUS

## 2019-09-21 MED ORDER — KETOROLAC TROMETHAMINE 30 MG/ML IJ SOLN
INTRAMUSCULAR | Status: AC
  Filled 2019-09-21: qty 1

## 2019-09-21 MED ORDER — LIDOCAINE 2% (20 MG/ML) 5 ML SYRINGE
INTRAMUSCULAR | Status: AC
  Filled 2019-09-21: qty 5

## 2019-09-21 MED ORDER — ARTIFICIAL TEARS OPHTHALMIC OINT
TOPICAL_OINTMENT | OPHTHALMIC | Status: DC | PRN
  Administered 2019-09-21: 1 via OPHTHALMIC

## 2019-09-21 MED ORDER — MEPERIDINE HCL 25 MG/ML IJ SOLN: 6.2500 mg | INTRAMUSCULAR | Status: DC | PRN

## 2019-09-21 MED ORDER — HYDROCODONE-ACETAMINOPHEN 5-325 MG PO TABS: 1.0000 | ORAL_TABLET | Freq: Four times a day (QID) | ORAL | 0 refills | Status: DC | PRN

## 2019-09-21 MED ORDER — HYDROMORPHONE HCL 1 MG/ML IJ SOLN
0.2500 mg | INTRAMUSCULAR | Status: DC | PRN
  Administered 2019-09-21: 0.5 mg via INTRAVENOUS

## 2019-09-21 MED ORDER — DEXAMETHASONE SODIUM PHOSPHATE 10 MG/ML IJ SOLN: 10.0000 mg | Freq: Once | INTRAMUSCULAR | Status: DC

## 2019-09-21 MED ORDER — PROPOFOL 10 MG/ML IV BOLUS
INTRAVENOUS | Status: AC
  Filled 2019-09-21: qty 20

## 2019-09-21 MED ORDER — OXYCODONE HCL 5 MG PO TABS: 5.0000 mg | ORAL_TABLET | Freq: Once | ORAL | Status: DC | PRN

## 2019-09-21 MED ORDER — OXYCODONE HCL 5 MG/5ML PO SOLN: 5.0000 mg | Freq: Once | ORAL | Status: DC | PRN

## 2019-09-21 MED ORDER — ONDANSETRON HCL 4 MG/2ML IJ SOLN
INTRAMUSCULAR | Status: DC | PRN
  Administered 2019-09-21: 4 mg via INTRAVENOUS

## 2019-09-21 MED ORDER — ARTIFICIAL TEARS OPHTHALMIC OINT
TOPICAL_OINTMENT | OPHTHALMIC | Status: AC
  Filled 2019-09-21: qty 3.5

## 2019-09-21 MED ORDER — LIDOCAINE-EPINEPHRINE 2 %-1:100000 IJ SOLN
INTRAMUSCULAR | Status: AC
  Filled 2019-09-21: qty 13.6

## 2019-09-21 MED ORDER — ACETAMINOPHEN 160 MG/5ML PO SOLN: 325.0000 mg | ORAL | Status: DC | PRN

## 2019-09-21 MED ORDER — ACETAMINOPHEN 325 MG PO TABS: 325.0000 mg | ORAL_TABLET | ORAL | Status: DC | PRN

## 2019-09-21 MED ORDER — KETOROLAC TROMETHAMINE 30 MG/ML IJ SOLN
30.0000 mg | Freq: Once | INTRAMUSCULAR | Status: AC | PRN
  Administered 2019-09-21: 30 mg via INTRAVENOUS

## 2019-09-21 MED ORDER — ROCURONIUM BROMIDE 100 MG/10ML IV SOLN
INTRAVENOUS | Status: DC | PRN
  Administered 2019-09-21: 80 mg via INTRAVENOUS

## 2019-09-21 MED ORDER — CEFAZOLIN SODIUM-DEXTROSE 2-4 GM/100ML-% IV SOLN
INTRAVENOUS | Status: AC
  Filled 2019-09-21: qty 100

## 2019-09-21 MED ORDER — GLYCOPYRROLATE 0.2 MG/ML IJ SOLN
INTRAMUSCULAR | Status: DC | PRN
  Administered 2019-09-21: 0.2 mg via INTRAVENOUS

## 2019-09-21 MED ORDER — NEOMYCIN-POLYMYXIN-DEXAMETH 3.5-10000-0.1 OP OINT
TOPICAL_OINTMENT | OPHTHALMIC | Status: DC | PRN
  Administered 2019-09-21: 1 via OPHTHALMIC

## 2019-09-21 MED ORDER — SUGAMMADEX SODIUM 500 MG/5ML IV SOLN
INTRAVENOUS | Status: DC | PRN
  Administered 2019-09-21: 400 mg via INTRAVENOUS

## 2019-09-21 MED ORDER — PROPOFOL 10 MG/ML IV BOLUS
INTRAVENOUS | Status: DC | PRN
  Administered 2019-09-21: 200 mg via INTRAVENOUS

## 2019-09-21 MED ORDER — 0.9 % SODIUM CHLORIDE (POUR BTL) OPTIME
TOPICAL | Status: DC | PRN
  Administered 2019-09-21: 1000 mL

## 2019-09-21 MED ORDER — CEFAZOLIN SODIUM-DEXTROSE 1-4 GM/50ML-% IV SOLN
INTRAVENOUS | Status: AC
  Filled 2019-09-21: qty 50

## 2019-09-21 SURGICAL SUPPLY — 51 items
APPLICATOR DR MATTHEWS STRL (MISCELLANEOUS) IMPLANT
ATTRACTOMAT 16X20 MAGNETIC DRP (DRAPES) IMPLANT
BIT DRILL UPPR FCE 0.9M 4 TWST (BIT) ×1 IMPLANT
BLADE SURG 15 STRL LF DISP TIS (BLADE) ×1 IMPLANT
BLADE SURG 15 STRL SS (BLADE) ×1
BUR CROSS CUT FISSURE 1.6 (BURR) IMPLANT
CANISTER SUCT 1200ML W/VALVE (MISCELLANEOUS) ×2 IMPLANT
COVER BACK TABLE REUSABLE LG (DRAPES) ×2 IMPLANT
COVER MAYO STAND REUSABLE (DRAPES) ×2 IMPLANT
COVER WAND RF STERILE (DRAPES) IMPLANT
DRAPE EENT ADH APERT 31X51 STR (DRAPES) IMPLANT
DRAPE U-SHAPE 76X120 STRL (DRAPES) ×2 IMPLANT
DRILL UPPERFACE 0.9M 4MM TWIST (BIT) ×2
DRSG TELFA 3X8 NADH (GAUZE/BANDAGES/DRESSINGS) ×2 IMPLANT
ELECT NEEDLE TIP 2.8 STRL (NEEDLE) ×2 IMPLANT
ELECT REM PT RETURN 9FT ADLT (ELECTROSURGICAL) ×2
ELECTRODE REM PT RTRN 9FT ADLT (ELECTROSURGICAL) ×1 IMPLANT
GAUZE 4X4 16PLY RFD (DISPOSABLE) IMPLANT
GLOVE BIO SURGEON STRL SZ 6.5 (GLOVE) ×4 IMPLANT
GLOVE ORTHO TXT STRL SZ7.5 (GLOVE) ×2 IMPLANT
GOWN STRL REUS W/ TWL LRG LVL3 (GOWN DISPOSABLE) ×3 IMPLANT
GOWN STRL REUS W/TWL LRG LVL3 (GOWN DISPOSABLE) ×3
NEEDLE BLUNT 17GA (NEEDLE) IMPLANT
NEEDLE DENTAL 27 LONG (NEEDLE) ×2 IMPLANT
NS IRRIG 1000ML POUR BTL (IV SOLUTION) ×2 IMPLANT
PACK BASIN DAY SURGERY FS (CUSTOM PROCEDURE TRAY) ×2 IMPLANT
PENCIL BUTTON HOLSTER BLD 10FT (ELECTRODE) ×2 IMPLANT
PLATE TRI DELTA 1.7X30 (Plate) ×2 IMPLANT
PLATE UPPER FACE 4H ORBITAL (Plate) ×2 IMPLANT
PLATE UPPER FACE 6X2H 3D (Plate) ×2 IMPLANT
PLATE UPPER FACE 7H T (Plate) ×2 IMPLANT
SCREW EMERGENCY 1.4X5 (Screw) ×2 IMPLANT
SCREW SELF DRILLING 1.2X3 (Screw) ×6 IMPLANT
SCREW SELF DRILLING 1.2X4 (Screw) ×10 IMPLANT
SCREW SELF TAPPING 1.2X5 (Screw) ×4 IMPLANT
SHEILD EYE MED CORNL SHD 22X21 (OPHTHALMIC RELATED) ×2
SHIELD EYE MED CORNL SHD 22X21 (OPHTHALMIC RELATED) ×1 IMPLANT
SLEEVE SCD COMPRESS KNEE MED (MISCELLANEOUS) ×2 IMPLANT
STAPLER VISISTAT 35W (STAPLE) ×2 IMPLANT
SUT BONE WAX W31G (SUTURE) IMPLANT
SUT CHROMIC 3 0 PS 2 (SUTURE) ×2 IMPLANT
SUT NOVAFIL 5 0 BLK 18 IN P13 (SUTURE) IMPLANT
SUT PLAIN GUT FAST 5-0 (SUTURE) IMPLANT
SUT PROLENE 5 0 P 3 (SUTURE) IMPLANT
SUT SILK 4 0 PS 2 (SUTURE) IMPLANT
SUT VICRYL 4-0 PS2 18IN ABS (SUTURE) IMPLANT
SYR 50ML LL SCALE MARK (SYRINGE) ×4 IMPLANT
SYR CONTROL 10ML LL (SYRINGE) ×2 IMPLANT
TOWEL GREEN STERILE FF (TOWEL DISPOSABLE) ×2 IMPLANT
TUBE CONNECTING 20X1/4 (TUBING) ×2 IMPLANT
YANKAUER SUCT BULB TIP NO VENT (SUCTIONS) ×2 IMPLANT

## 2019-09-21 NOTE — Anesthesia Preprocedure Evaluation (Signed)
Anesthesia Evaluation  Patient identified by MRN, date of birth, ID band Patient awake    Reviewed: Allergy & Precautions, NPO status , Patient's Chart, lab work & pertinent test results  Airway      Mouth opening: Limited Mouth Opening  Dental no notable dental hx. (+) Teeth Intact   Pulmonary neg pulmonary ROS,    Pulmonary exam normal breath sounds clear to auscultation       Cardiovascular negative cardio ROS Normal cardiovascular exam Rhythm:Regular Rate:Normal     Neuro/Psych PSYCHIATRIC DISORDERS Anxiety Depression negative neurological ROS     GI/Hepatic negative GI ROS, Neg liver ROS,   Endo/Other  diabetes, Type 2, Oral Hypoglycemic Agents  Renal/GU negative Renal ROS  negative genitourinary   Musculoskeletal negative musculoskeletal ROS (+)   Abdominal (+) + obese,   Peds  Hematology negative hematology ROS (+)   Anesthesia Other Findings   Reproductive/Obstetrics                             Anesthesia Physical Anesthesia Plan  ASA: II  Anesthesia Plan: General   Post-op Pain Management:    Induction: Intravenous  PONV Risk Score and Plan: 3 and Ondansetron, Dexamethasone and Midazolam  Airway Management Planned: Oral ETT  Additional Equipment:   Intra-op Plan:   Post-operative Plan: Extubation in OR  Informed Consent: I have reviewed the patients History and Physical, chart, labs and discussed the procedure including the risks, benefits and alternatives for the proposed anesthesia with the patient or authorized representative who has indicated his/her understanding and acceptance.     Dental advisory given  Plan Discussed with: CRNA  Anesthesia Plan Comments:         Anesthesia Quick Evaluation

## 2019-09-21 NOTE — Anesthesia Procedure Notes (Signed)
Procedure Name: Intubation Date/Time: 09/21/2019 1:10 PM Performed by: Lavonia Dana, CRNA Pre-anesthesia Checklist: Patient identified, Emergency Drugs available, Suction available and Patient being monitored Patient Re-evaluated:Patient Re-evaluated prior to induction Oxygen Delivery Method: Circle system utilized Preoxygenation: Pre-oxygenation with 100% oxygen Induction Type: IV induction Ventilation: Two handed mask ventilation required and Oral airway inserted - appropriate to patient size Laryngoscope Size: Glidescope and 4 Grade View: Grade I Tube type: Oral Rae Tube size: 8.0 mm Number of attempts: 1 Airway Equipment and Method: Stylet,  Oral airway and Video-laryngoscopy Placement Confirmation: ETT inserted through vocal cords under direct vision,  positive ETCO2 and breath sounds checked- equal and bilateral Secured at: 23 cm Tube secured with: Tape Dental Injury: Teeth and Oropharynx as per pre-operative assessment

## 2019-09-21 NOTE — Discharge Instructions (Addendum)
Sinus precautions to include: no smoking, no nose blowing, open mouth sneezes, no straws. Place thin layer of ophthalmic bacitracin in right lower eyelid twice daily for 5 days. Do not sleep on right side. Soft, nonchew diet until further notice.    Call your surgeon if you experience:   1.  Fever over 101.0. 2.  Inability to urinate. 3.  Nausea and/or vomiting. 4.  Extreme swelling or bruising at the surgical site. 5.  Continued bleeding from the incision. 6.  Increased pain, redness or drainage from the incision. 7.  Problems related to your pain medication. 8.  Any problems and/or concerns     Post Anesthesia Home Care Instructions  Activity: Get plenty of rest for the remainder of the day. A responsible individual must stay with you for 24 hours following the procedure.  For the next 24 hours, DO NOT: -Drive a car -Paediatric nurse -Drink alcoholic beverages -Take any medication unless instructed by your physician -Make any legal decisions or sign important papers.  Meals: Start with liquid foods such as gelatin or soup. Progress to regular foods as tolerated. Avoid greasy, spicy, heavy foods. If nausea and/or vomiting occur, drink only clear liquids until the nausea and/or vomiting subsides. Call your physician if vomiting continues.  Special Instructions/Symptoms: Your throat may feel dry or sore from the anesthesia or the breathing tube placed in your throat during surgery. If this causes discomfort, gargle with warm salt water. The discomfort should disappear within 24 hours.  If you had a scopolamine patch placed behind your ear for the management of post- operative nausea and/or vomiting:  1. The medication in the patch is effective for 72 hours, after which it should be removed.  Wrap patch in a tissue and discard in the trash. Wash hands thoroughly with soap and water. 2. You may remove the patch earlier than 72 hours if you experience unpleasant side effects  which may include dry mouth, dizziness or visual disturbances. 3. Avoid touching the patch. Wash your hands with soap and water after contact with the patch.

## 2019-09-21 NOTE — Anesthesia Postprocedure Evaluation (Signed)
Anesthesia Post Note  Patient: Ricky Whitney  Procedure(s) Performed: OPEN REDUCTION INTERNAL FIXATION (ORIF) RIGHT ZYGOMATICOMAXILLARY FRACTURE (Right Face)     Patient location during evaluation: PACU Anesthesia Type: General Level of consciousness: sedated Pain management: pain level controlled Vital Signs Assessment: post-procedure vital signs reviewed and stable Respiratory status: spontaneous breathing Cardiovascular status: stable Postop Assessment: no apparent nausea or vomiting Anesthetic complications: no    Last Vitals:  Vitals:   09/21/19 1615 09/21/19 1630  BP: 138/76 136/72  Pulse: 79 79  Resp: 11 11  Temp:    SpO2: 100% 92%    Last Pain:  Vitals:   09/21/19 1630  PainSc: 5    Pain Goal:                   Huston Foley
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
## 2019-09-21 NOTE — Brief Op Note (Signed)
09/21/2019  4:09 PM  PATIENT:  [name]  43 y.o. male  PRE-OPERATIVE DIAGNOSIS:  RIGHT OPEN ZYGOMATICOMAXILLARY COMPLEX  FRACTURE  POST-OPERATIVE DIAGNOSIS:  RIGHT OPEN ZYGOMATICOMAXILLARY COMPLEX  FRACTURE  PROCEDURE:  Procedure(s): OPEN REDUCTION INTERNAL FIXATION (ORIF) RIGHT ZYGOMATICOMAXILLARY FRACTURE (Right) - intraoral and extraoral approaches.  SURGEON:  Surgeon(s) and Role:    * Harvin Konicek, Optician, dispensing, DMD - Primary  ASSISTANTS: Jilda Panda, Lindsay Hargett   ANESTHESIA:   general  EBL:  200 mL   BLOOD ADMINISTERED:none  DRAINS: none   LOCAL MEDICATIONS USED:  LIDOCAINE   SPECIMEN:  No Specimen  DISPOSITION OF SPECIMEN:  N/A  COUNTS:  YES  TOURNIQUET:  * No tourniquets in log *  DICTATION: .Dragon Dictation  PLAN OF CARE: Discharge to home after PACU  PATIENT DISPOSITION:  PACU - hemodynamically stable.   Delay start of Pharmacological VTE agent (>24hrs) due to surgical blood loss or risk of bleeding: not applicable

## 2019-09-21 NOTE — Transfer of Care (Signed)
Immediate Anesthesia Transfer of Care Note  Patient: Ricky Whitney  Procedure(s) Performed: OPEN REDUCTION INTERNAL FIXATION (ORIF) RIGHT ZYGOMATICOMAXILLARY FRACTURE (Right Face)  Patient Location: PACU  Anesthesia Type:General  Level of Consciousness: awake, alert  and oriented  Airway & Oxygen Therapy: Patient Spontanous Breathing and Patient connected to face mask oxygen  Post-op Assessment: Report given to RN and Post -op Vital signs reviewed and stable  Post vital signs: Reviewed and stable  Last Vitals:  Vitals Value Taken Time  BP    Temp    Pulse 85 09/21/19 1604  Resp 14 09/21/19 1604  SpO2 100 % 09/21/19 1604  Vitals shown include unvalidated device data.  Last Pain:  Vitals:   09/21/19 1209  PainSc: 0-No pain         Complications: No apparent anesthesia complications

## 2019-09-23 ENCOUNTER — Encounter (HOSPITAL_BASED_OUTPATIENT_CLINIC_OR_DEPARTMENT_OTHER): Payer: Self-pay | Admitting: Oral Surgery

## 2019-09-27 ENCOUNTER — Other Ambulatory Visit: Payer: Self-pay

## 2019-09-27 ENCOUNTER — Ambulatory Visit: Payer: BC Managed Care – PPO | Admitting: Registered Nurse

## 2019-09-27 ENCOUNTER — Encounter: Payer: Self-pay | Admitting: Registered Nurse

## 2019-09-27 VITALS — BP 117/83 | HR 60 | Temp 98.7°F | Resp 16 | Ht 71.26 in | Wt 273.0 lb

## 2019-09-27 DIAGNOSIS — E119 Type 2 diabetes mellitus without complications: Secondary | ICD-10-CM | POA: Diagnosis not present

## 2019-09-27 MED ORDER — METFORMIN HCL 500 MG PO TABS: 500.0000 mg | ORAL_TABLET | Freq: Two times a day (BID) | ORAL | 0 refills | Status: DC

## 2019-09-27 NOTE — Patient Instructions (Signed)
° ° ° °  If you have lab work done today you will be contacted with your lab results within the next 2 weeks.  If you have not heard from us then please contact us. The fastest way to get your results is to register for My Chart. ° ° °IF you received an x-ray today, you will receive an invoice from [facility]. Please contact [facility] at [phone] with questions or concerns regarding your invoice.  ° °IF you received labwork today, you will receive an invoice from [facility]. Please contact [facility] at [phone] with questions or concerns regarding your invoice.  ° °Our billing staff will not be able to assist you with questions regarding bills from these companies. ° °You will be contacted with the lab results as soon as they are available. The fastest way to get your results is to activate your My Chart account. Instructions are located on the last page of this paperwork. If you have not heard from us regarding the results in 2 weeks, please contact this office. °  ° ° ° °
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
## 2019-10-13 ENCOUNTER — Other Ambulatory Visit: Payer: Self-pay | Admitting: Registered Nurse

## 2019-10-13 NOTE — Telephone Encounter (Signed)
Copied from [place] [phone]. Topic: General - Other >> Oct 13, 2019  9:35 AM Ricky Whitney wrote: Pt req RX for the below medication   FLUoxetine (PROZAC) 20 MG tablet      [facility] [address]

## 2019-10-14 MED ORDER — FLUOXETINE HCL 20 MG PO TABS: 80.0000 mg | ORAL_TABLET | Freq: Every day | ORAL | 2 refills | Status: DC

## 2019-10-14 NOTE — Telephone Encounter (Signed)
Okay to send? Historical med

## 2019-11-06 ENCOUNTER — Other Ambulatory Visit: Payer: Self-pay | Admitting: Registered Nurse

## 2019-12-30 ENCOUNTER — Ambulatory Visit: Payer: BC Managed Care – PPO | Admitting: Registered Nurse

## 2019-12-31 ENCOUNTER — Other Ambulatory Visit: Payer: Self-pay | Admitting: Registered Nurse

## 2019-12-31 DIAGNOSIS — E119 Type 2 diabetes mellitus without complications: Secondary | ICD-10-CM

## 2020-01-02 ENCOUNTER — Ambulatory Visit: Payer: BC Managed Care – PPO | Admitting: Registered Nurse

## 2020-01-02 ENCOUNTER — Encounter: Payer: Self-pay | Admitting: Registered Nurse

## 2020-01-02 ENCOUNTER — Other Ambulatory Visit: Payer: Self-pay

## 2020-01-02 VITALS — BP 121/75 | HR 66 | Temp 98.2°F | Ht 71.0 in | Wt 276.0 lb

## 2020-01-02 DIAGNOSIS — E119 Type 2 diabetes mellitus without complications: Secondary | ICD-10-CM

## 2020-01-02 LAB — POCT GLYCOSYLATED HEMOGLOBIN (HGB A1C): Hemoglobin A1C: 7.1 % — AB (ref 4.0–5.6)

## 2020-01-02 MED ORDER — METFORMIN HCL 500 MG PO TABS: 500.0000 mg | ORAL_TABLET | Freq: Two times a day (BID) | ORAL | 1 refills | Status: DC

## 2020-01-02 NOTE — Patient Instructions (Signed)
° ° ° °  If you have lab work done today you will be contacted with your lab results within the next 2 weeks.  If you have not heard from us then please contact us. The fastest way to get your results is to register for My Chart. ° ° °IF you received an x-ray today, you will receive an invoice from [facility]. Please contact [facility] at [phone] with questions or concerns regarding your invoice.  ° °IF you received labwork today, you will receive an invoice from [facility]. Please contact [facility] at [phone] with questions or concerns regarding your invoice.  ° °Our billing staff will not be able to assist you with questions regarding bills from these companies. ° °You will be contacted with the lab results as soon as they are available. The fastest way to get your results is to activate your My Chart account. Instructions are located on the last page of this paperwork. If you have not heard from us regarding the results in 2 weeks, please contact this office. °  ° ° ° °

## 2020-01-02 NOTE — Progress Notes (Signed)
Established Patient Office Visit  Subjective:  Patient ID: Ricky Whitney, male    DOB: 11/09/1976  Age: 44 y.o. MRN: [id number]  CC:  Chief Complaint  Patient presents with  . Diabetes    3 m f/u     HPI MADDOCK FINIGAN presents for T2DM 3 mo follow up  Reports compliance with medications, improved lifestyle  Has been checking sugars multiple times daily - both pre and post prandial.   Otherwise no major changes since last visit  Has seen his ophthalmologist, will have records faxed to our office.   Past Medical History:  Diagnosis Date  . Anxiety   . Depression   . Diabetes mellitus without complication ([place])   . Vaping-related disorder     Past Surgical History:  Procedure Laterality Date  . ORIF ORBITAL FRACTURE Right 09/21/2019   Procedure: OPEN REDUCTION INTERNAL FIXATION (ORIF) RIGHT ZYGOMATICOMAXILLARY FRACTURE;  Surgeon: Michael Litter, DMD;  Location: [facility];  Service: Oral Surgery;  Laterality: Right;  . wisdon teeth       Family History  Problem Relation Age of Onset  . Diabetes Mellitus II Mother   . Hypertension Mother   . Heart attack Mother   . Diabetes Mellitus II Father   . Hypertension Father   . Diabetes Mellitus II Brother   . Hypertension Brother     Social History   Socioeconomic History  . Marital status: Married    Spouse name: Not on file  . Number of children: Not on file  . Years of education: Not on file  . Highest education level: Not on file  Occupational History  . Not on file  Tobacco Use  . Smoking status: Never Smoker  . Smokeless tobacco: Never Used  Substance and Sexual Activity  . Alcohol use: Not Currently    Comment: occassional  . Drug use: Yes    Comment: vaping  . Sexual activity: Not Currently  Other Topics Concern  . Not on file  Social History Narrative   ** Merged History Encounter **       Social Determinants of Health   Financial Resource Strain:   . Difficulty of Paying  Living Expenses: Not on file  Food Insecurity:   . Worried About Charity fundraiser in the Last Year: Not on file  . Ran Out of Food in the Last Year: Not on file  Transportation Needs:   . Lack of Transportation (Medical): Not on file  . Lack of Transportation (Non-Medical): Not on file  Physical Activity:   . Days of Exercise per Week: Not on file  . Minutes of Exercise per Session: Not on file  Stress:   . Feeling of Stress : Not on file  Social Connections:   . Frequency of Communication with Friends and Family: Not on file  . Frequency of Social Gatherings with Friends and Family: Not on file  . Attends Religious Services: Not on file  . Active Member of Clubs or Organizations: Not on file  . Attends Archivist Meetings: Not on file  . Marital Status: Not on file  Intimate Partner Violence:   . Fear of Current or Ex-Partner: Not on file  . Emotionally Abused: Not on file  . Physically Abused: Not on file  . Sexually Abused: Not on file    Outpatient Medications Prior to Visit  Medication Sig Dispense Refill  . Fluoxetine HCl, PMDD, 20 MG TABS TAKE 4 TABLETS BY MOUTH  AT BEDTIME 360 tablet 0  . glucose monitoring kit (FREESTYLE) monitoring kit 1 each by Does not apply route as needed for other. Dispense any model that is covered- dispense testing supplies for Q TID AC/ HS accuchecks- 1 month supply with one refil. 1 each 1  . Lancets (ONETOUCH DELICA PLUS [id number]) MISC CHECK BLOOD SUGAR BEFORE MEALS AND AT BEDTIME    . metFORMIN (GLUCOPHAGE) 500 MG tablet TAKE 1 TABLET (500 MG TOTAL) BY MOUTH 2 (TWO) TIMES DAILY WITH A MEAL. 180 tablet 0  . ONETOUCH VERIO test strip TEST BLOOD SUGAR BEFORE MEALS AND AT BEDTIME    . acetaminophen (TYLENOL) 325 MG tablet Take 2 tablets (650 mg total) by mouth every 4 (four) hours as needed for mild pain. (Patient not taking: Reported on 01/02/2020)    . chlorhexidine (PERIDEX) 0.12 % solution Use as directed 15 mLs in the mouth or  throat 3 (three) times daily. 120 mL 0  . HYDROcodone-acetaminophen (NORCO/VICODIN) 5-325 MG tablet Take 1-2 tablets by mouth every 6 (six) hours as needed for moderate pain or severe pain. (Patient not taking: Reported on 09/27/2019) 20 tablet 0   No facility-administered medications prior to visit.    No Known Allergies  ROS Review of Systems  Constitutional: Negative.   HENT: Negative.   Eyes: Negative.   Respiratory: Negative.   Cardiovascular: Negative.   Gastrointestinal: Negative.   Endocrine: Negative.   Genitourinary: Negative.   Musculoskeletal: Negative.   Skin: Negative.   Allergic/Immunologic: Negative.   Neurological: Negative.   Hematological: Negative.   Psychiatric/Behavioral: Negative.   All other systems reviewed and are negative.     Objective:    Physical Exam  Constitutional: He is oriented to person, place, and time. He appears well-developed and well-nourished. No distress.  Cardiovascular: Normal rate, regular rhythm and normal heart sounds. Exam reveals no gallop and no friction rub.  No murmur heard. Pulmonary/Chest: Effort normal and breath sounds normal. No respiratory distress. He has no wheezes. He has no rales. He exhibits no tenderness.  Neurological: He is alert and oriented to person, place, and time.  Skin: Skin is warm and dry. No rash noted. He is not diaphoretic. No erythema. No pallor.  Psychiatric: He has a normal mood and affect. His behavior is normal. Judgment and thought content normal.  Nursing note and vitals reviewed.   BP 121/75   Pulse 66   Temp 98.2 F (36.8 C) (Temporal)   Ht 5' 11"  (1.803 m)   Wt 276 lb (125.2 kg)   SpO2 97%   BMI 38.49 kg/m  Wt Readings from Last 3 Encounters:  01/02/20 276 lb (125.2 kg)  09/27/19 273 lb (123.8 kg)  09/21/19 273 lb 13 oz (124.2 kg)     Health Maintenance Due  Topic Date Due  . PNEUMOCOCCAL POLYSACCHARIDE VACCINE AGE 26-64 HIGH RISK  03/26/1980  . URINE MICROALBUMIN   03/26/1988  . HIV Screening  03/26/1993    There are no preventive care reminders to display for this patient.  No results found for: TSH Lab Results  Component Value Date   WBC 9.4 09/11/2019   HGB 16.0 09/11/2019   HCT 45.6 09/11/2019   MCV 92.7 09/11/2019   PLT 259 09/11/2019   Lab Results  Component Value Date   NA 137 09/11/2019   K 4.3 09/11/2019   CO2 24 09/11/2019   GLUCOSE 217 (H) 09/11/2019   BUN 11 09/11/2019   CREATININE 0.98 09/11/2019   BILITOT 0.6  09/10/2019   ALKPHOS 102 09/10/2019   AST 30 09/10/2019   ALT 49 (H) 09/10/2019   PROT 7.3 09/10/2019   ALBUMIN 4.4 09/10/2019   CALCIUM 8.6 (L) 09/11/2019   ANIONGAP 8 09/11/2019   Lab Results  Component Value Date   CHOL 191 09/11/2019   Lab Results  Component Value Date   HDL 30 (L) 09/11/2019   Lab Results  Component Value Date   LDLCALC 131 (H) 09/11/2019   Lab Results  Component Value Date   TRIG 152 (H) 09/11/2019   Lab Results  Component Value Date   CHOLHDL 6.4 09/11/2019   Lab Results  Component Value Date   HGBA1C 9.6 (H) 09/11/2019      Assessment & Plan:   Problem List Items Addressed This Visit      Endocrine   Type 2 diabetes mellitus without complication, without long-term current use of insulin (HCC) - Primary   Relevant Medications   metFORMIN (GLUCOPHAGE) 500 MG tablet   Other Relevant Orders   POCT glycosylated hemoglobin (Hb A1C) (Completed)   Microalbumin/Creatinine Ratio, Urine   HM Diabetes Foot Exam (Completed)      Meds ordered this encounter  Medications  . metFORMIN (GLUCOPHAGE) 500 MG tablet    Sig: Take 1 tablet (500 mg total) by mouth 2 (two) times daily with a meal.    Dispense:  180 tablet    Refill:  1    Order Specific Question:   Supervising Provider    Answer:   Forrest Moron [id number]    Follow-up: Return in about 6 months (around 07/01/2020) for T2DM check.   PLAN  A1c has decreased to 7.1  Will plan to continue metformin at 550m  PO bid  Encouraged once daily glucose checks upon waking   Will plan to follow up in 6 mo  Patient encouraged to call clinic with any questions, comments, or concerns.  RMaximiano Coss NP

## 2020-01-03 LAB — MICROALBUMIN / CREATININE URINE RATIO
Creatinine, Urine: 149.3 mg/dL
Microalb/Creat Ratio: 6 mg/g creat (ref 0–29)
Microalbumin, Urine: 8.6 ug/mL

## 2020-01-24 ENCOUNTER — Other Ambulatory Visit: Payer: Self-pay | Admitting: Registered Nurse

## 2020-01-24 NOTE — Telephone Encounter (Signed)
Patient is requesting a refill of the following medications: Requested Prescriptions   Pending Prescriptions Disp Refills  . FLUoxetine (PROZAC) 20 MG tablet [Pharmacy Med Name: FLUOXETINE HCL 20 MG TABLET] 360 tablet 1    Sig: TAKE 4 TABLETS BY MOUTH AT BEDTIME    Date of patient request: 01/24/2020 Last office visit: 01/02/2020 Date of last refill:11/06/2019 Last refill amount:360 tablets x 4 daily Follow up time period per chart: Future Appointment Scheduled.

## 2020-05-10 IMAGING — CT CT HEAD W/O CM
4 series · 15 of 47 positions shown, 17 images · non-contrast
Comparison: 08/23/2005

CLINICAL DATA: Head trauma, transmission fell on head

EXAM:
CT HEAD WITHOUT CONTRAST
CT MAXILLOFACIAL WITHOUT CONTRAST
CT CERVICAL SPINE WITHOUT CONTRAST
TECHNIQUE: Multidetector CT imaging of the head, cervical spine, and
maxillofacial structures were performed using the standard protocol
without intravenous contrast. Multiplanar CT image reconstructions
of the cervical spine and maxillofacial structures were also
generated.

[Series 3: head wo · axial · 0.43mm/px · z∈[-92,+34]mm · 7 of 35 slices shown, 9 images]
[im 5/35  brain]
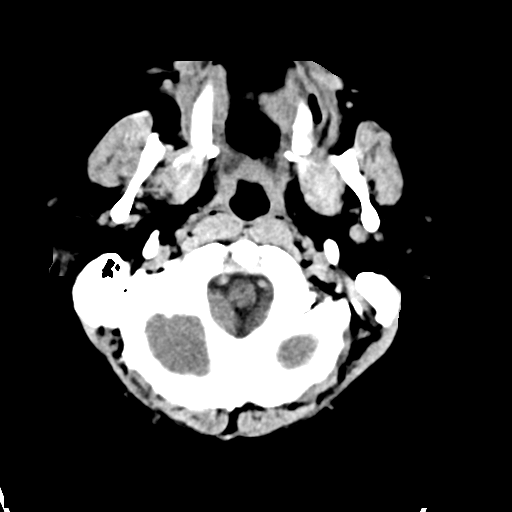
[im 5/35  bone]
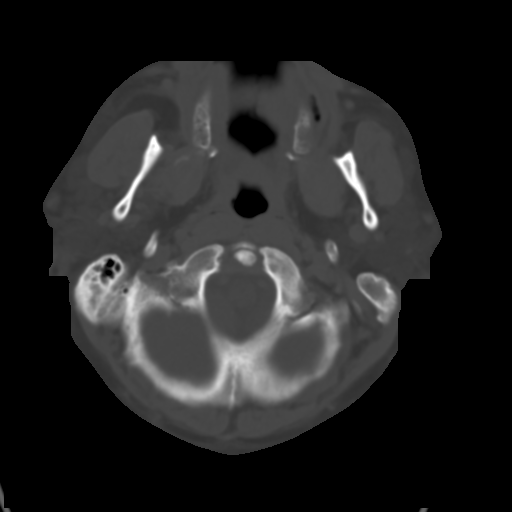
[im 9/35  brain]
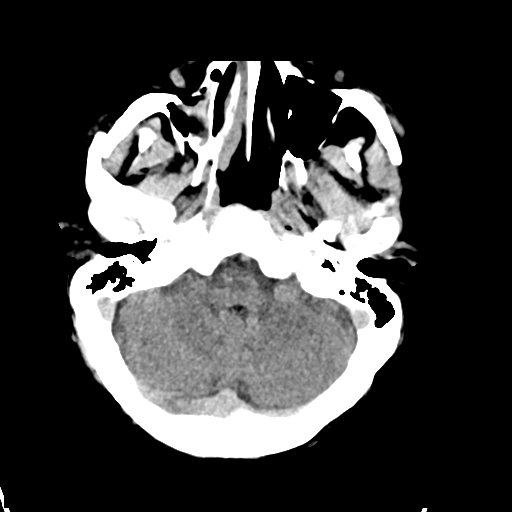
[im 13/35  brain]
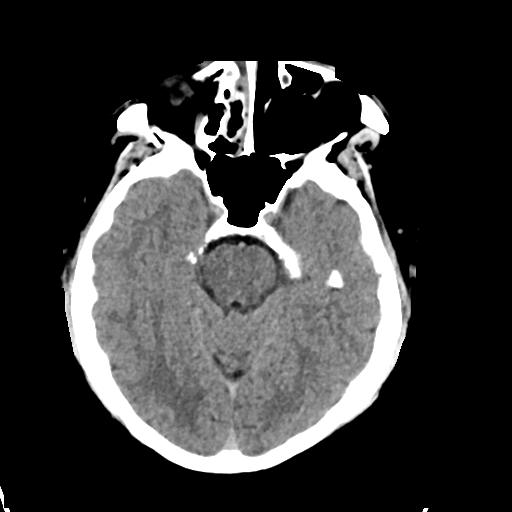
[im 18/35  brain]
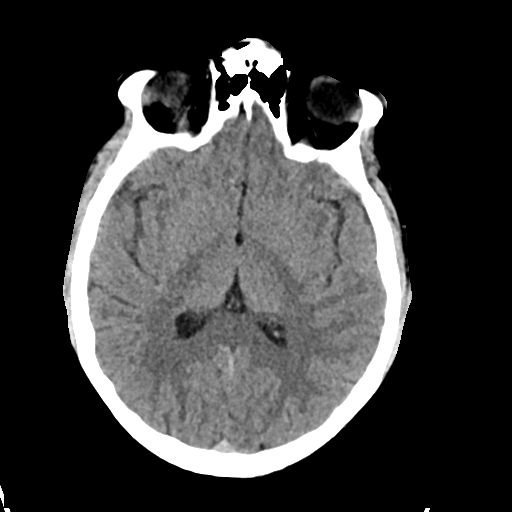
[im 22/35  brain]
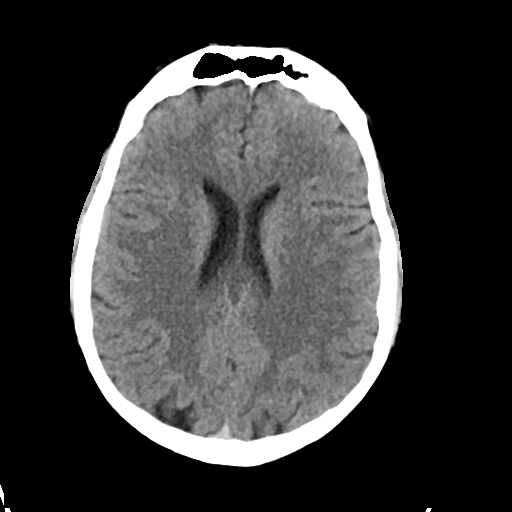
[im 22/35  bone]
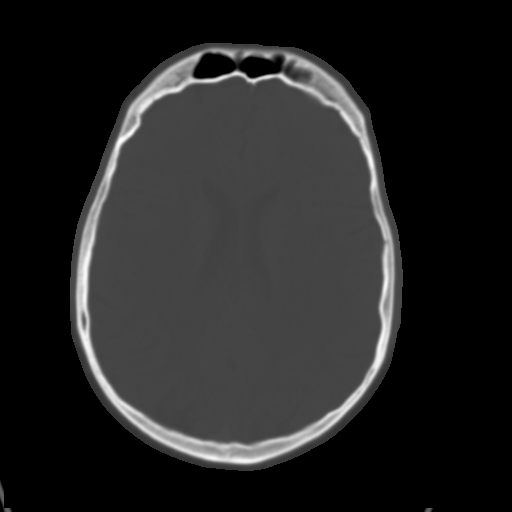
[im 26/35  brain]
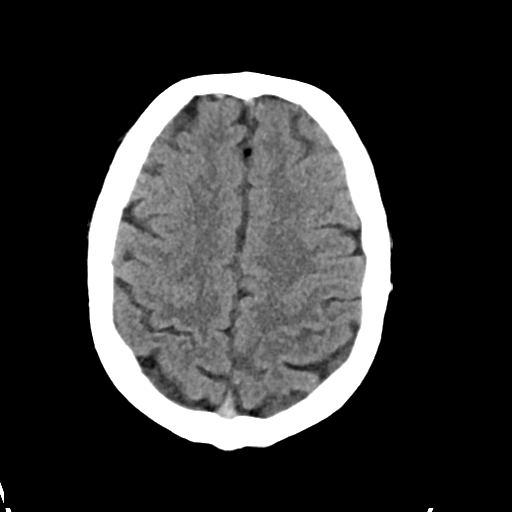
[im 30/35  brain]
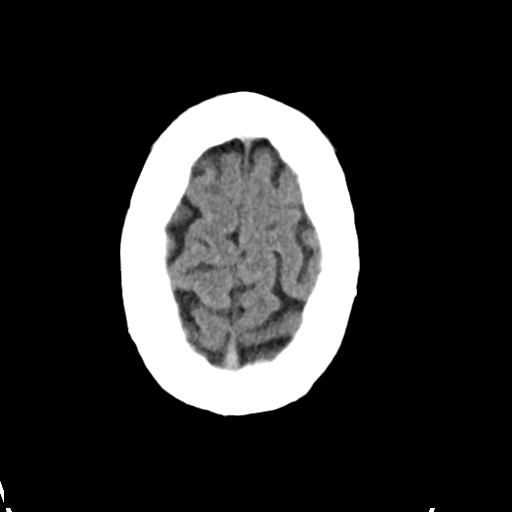

[Series 4: head bone · axial · 0.43mm/px · z∈[-96,-78]mm · 2 of 87 slices shown]
[im 9/87  bone]
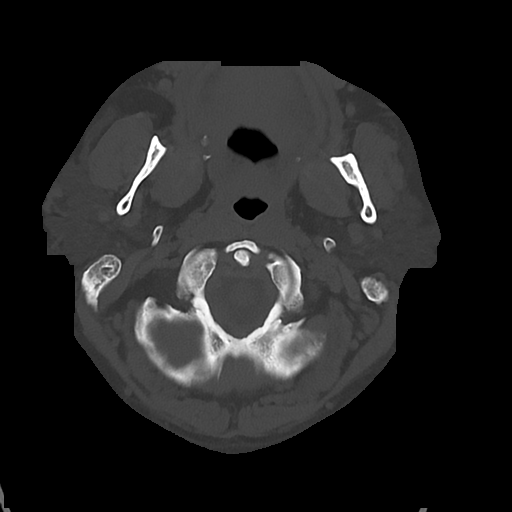
[im 18/87  bone]
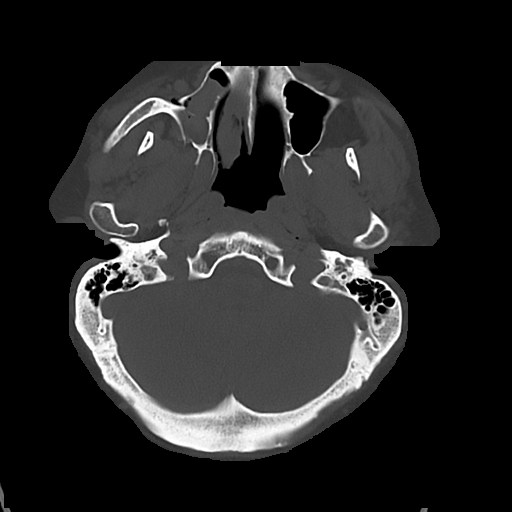

[Series 5: cor soft · coronal · 0.38mm/px · 3 of 74 slices shown]
[im 25/74  brain]
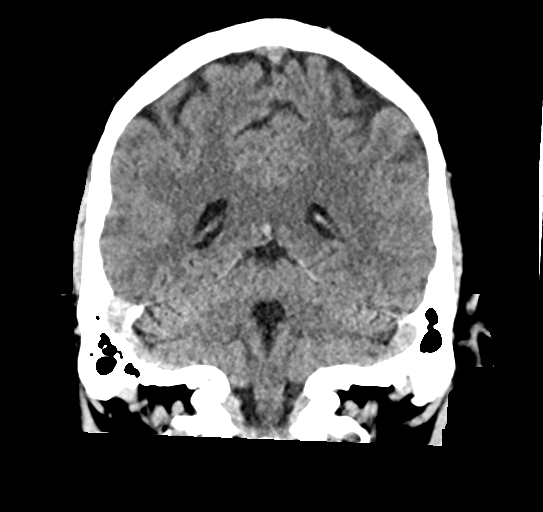
[im 33/74  brain]
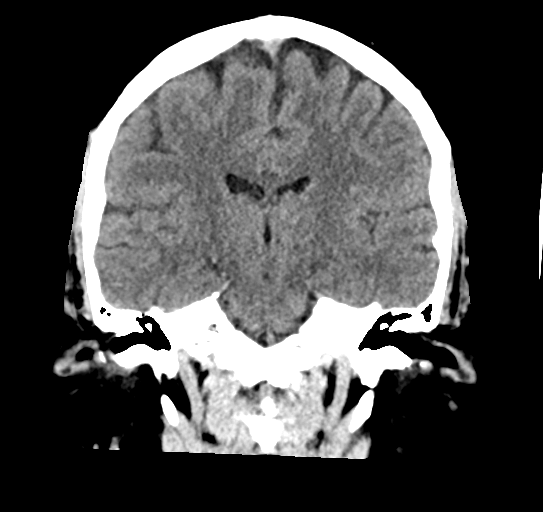
[im 41/74  brain]
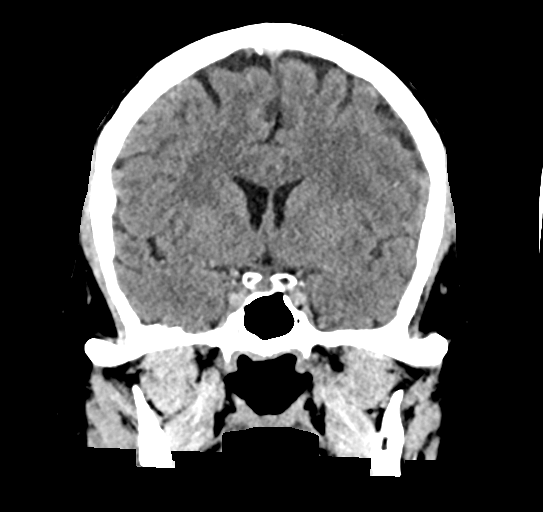

[Series 6: sag soft · sagittal · 0.37mm/px · 3 of 70 slices shown]
[im 24/70  brain]
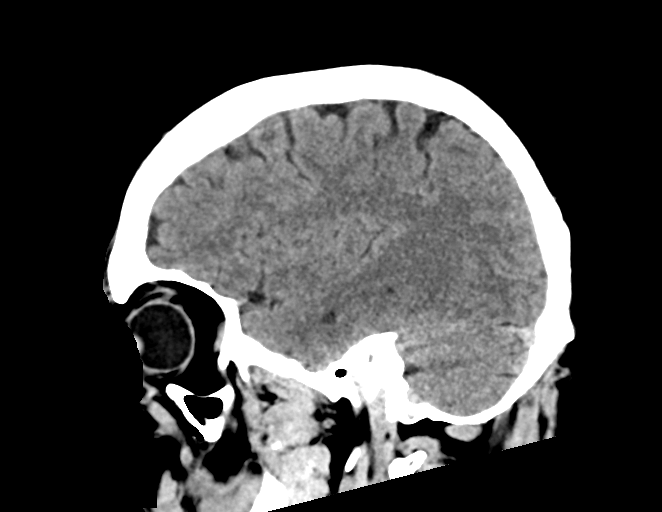
[im 35/70  brain]
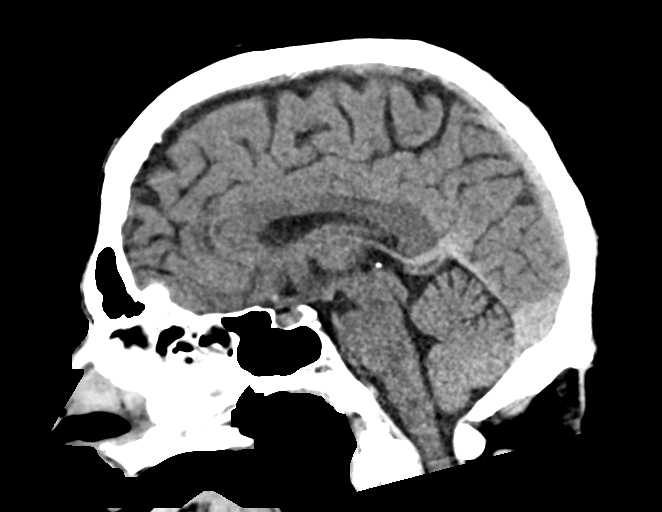
[im 47/70  brain]
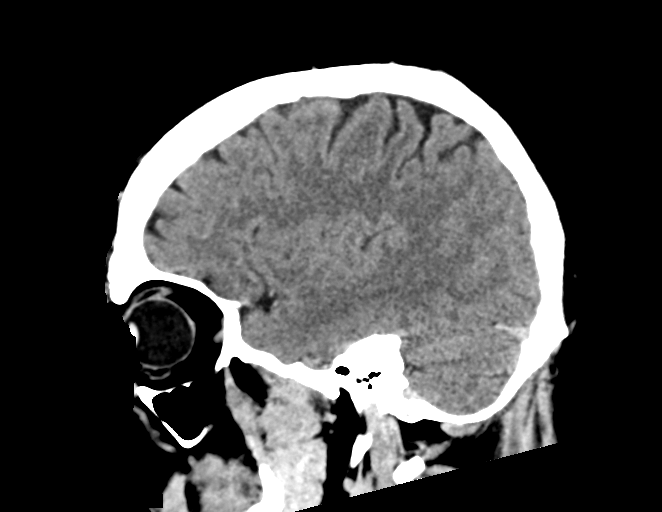

[15 of 47 positions shown; findings below may reference images not displayed]

FINDINGS: CT HEAD FINDINGS

Brain: No evidence of acute infarction, hemorrhage, hydrocephalus,
extra-axial collection or mass lesion/mass effect.

Vascular: No hyperdense vessel or unexpected calcification.

CT FACIAL BONES FINDINGS

Skull: Normal. Negative for fracture or focal lesion.

Facial bones: There is a substantial displaced, tripod morphology
fracture of the right zygoma and orbit, with comminuted and
displaced fracture fragments particularly involving the orbital
floor and lateral maxillary wall (series 5, image 41, 50, series 9,
image 47, 42). There are additional depressed fractures of the right
lamina papyracea. The superior orbit, ethmoid, and frontal sinuses
appear intact as well as the hard palate and pterygoid. There are no
left-sided facial fractures appreciated.

Sinuses/Orbits: There is fat herniation through the displaced right
orbital floor without rectus herniation. The right globe and orbital
contents are grossly intact.

Other: None.

CT CERVICAL SPINE FINDINGS

Alignment: Normal.

Skull base and vertebrae: No acute fracture. No primary bone lesion
or focal pathologic process.

Soft tissues and spinal canal: No prevertebral fluid or swelling. No
visible canal hematoma.

Disc levels: Mild multilevel disc space height loss and
osteophytosis.

Upper chest: Negative.

Other: None.
IMPRESSION: 1.  No acute intracranial pathology.

2. There is a substantial displaced, tripod morphology fracture of
the right zygoma and orbit, with comminuted and displaced fracture
fragments particularly involving the orbital floor and lateral
maxillary wall (series 5, image 41, 50, series 9, image 47, 42).
There are additional depressed fractures of the right lamina
papyracea. The superior orbit, ethmoid, and frontal sinuses appear
intact as well as the hard palate and pterygoid. There are no
left-sided facial fractures appreciated.

3. There is fat herniation through the displaced right orbital floor
without rectus herniation. The right globe and orbital contents are
grossly intact.

4.  No fracture or static subluxation of the cervical spine.

## 2020-07-02 ENCOUNTER — Encounter: Payer: BC Managed Care – PPO | Admitting: Registered Nurse

## 2020-07-03 ENCOUNTER — Encounter: Payer: Self-pay | Admitting: Registered Nurse

## 2020-08-24 ENCOUNTER — Encounter: Payer: Self-pay | Admitting: Registered Nurse

## 2020-08-28 ENCOUNTER — Encounter: Payer: Self-pay | Admitting: Registered Nurse

## 2020-09-05 ENCOUNTER — Telehealth: Payer: Self-pay | Admitting: Registered Nurse

## 2020-09-05 NOTE — Telephone Encounter (Signed)
Called pt to get physical that was  A no show rescheduled voice mail box full

## 2020-10-19 ENCOUNTER — Other Ambulatory Visit: Payer: Self-pay | Admitting: Registered Nurse

## 2020-10-19 DIAGNOSIS — E119 Type 2 diabetes mellitus without complications: Secondary | ICD-10-CM

## 2020-10-19 NOTE — Telephone Encounter (Signed)
Requested medication (s) are due for refill today: no  Requested medication (s) are on the active medication list: yes   Last refill:  07/27/2020  Future visit scheduled: yes   Notes to clinic:  overdue for follow up appointment    Requested Prescriptions  Pending Prescriptions Disp Refills   metFORMIN (GLUCOPHAGE) 500 MG tablet [Pharmacy Med Name: METFORMIN HCL 500 MG TABLET] 180 tablet 1    Sig: TAKE 1 TABLET BY MOUTH TWICE A DAY WITH A MEAL      Endocrinology:  Diabetes - Biguanides Failed - 10/19/2020  2:36 AM      Failed - Cr in normal range and within 360 days    Creatinine, Ser  Date Value Ref Range Status  09/11/2019 0.98 0.61 - 1.24 mg/dL Final          Failed - HBA1C is between 0 and 7.9 and within 180 days    Hemoglobin A1C  Date Value Ref Range Status  01/02/2020 7.1 (A) 4.0 - 5.6 % Final   Hgb A1c MFr Bld  Date Value Ref Range Status  09/11/2019 9.6 (H) 4.8 - 5.6 % Final    Comment:    (NOTE) Pre diabetes:          5.7%-6.4% Diabetes:              >6.4% Glycemic control for   <7.0% adults with diabetes           Failed - eGFR in normal range and within 360 days    GFR calc Af Amer  Date Value Ref Range Status  09/11/2019 >60 >60 mL/min Final   GFR calc non Af Amer  Date Value Ref Range Status  09/11/2019 >60 >60 mL/min Final          Failed - Valid encounter within last 6 months    Recent Outpatient Visits           9 months ago Type 2 diabetes mellitus without complication, without long-term current use of insulin ([place])   [facility], Delfino Lovett, NP   1 year ago New onset type 2 diabetes mellitus [facility])   [facility], Amelia, NP   2 years ago Recurrent major depressive disorder, in partial remission [facility])   [facility], Laurey Arrow, MD   4 years ago Recurrent major depressive disorder, in partial remission [facility])   [facility] at [facility], Laurey Arrow, MD   5 years ago Annual physical exam   Primary  Care at [facility], MD

## 2020-10-19 NOTE — Telephone Encounter (Signed)
Pt needs to have a follow up appointment in order get additional refills.

## 2020-10-19 NOTE — Telephone Encounter (Signed)
10/19/2020 - PATIENT REQUESTING A REFILL ON HIS METFORMIN HCL 500 mg. I TRIED TO CALL AND SCHEDULE A FOLLOW-UP APPOINTMENT WITH RICH MORROW BUT THE MAIL BOX IS FULL AND NOT EXCEPTING ANY MESSAGES. I WILL ROUTE BACK TO THE CLINICAL TEAM FOR REVIEW. MBC

## 2020-11-14 ENCOUNTER — Other Ambulatory Visit: Payer: Self-pay | Admitting: Registered Nurse

## 2020-11-14 DIAGNOSIS — E119 Type 2 diabetes mellitus without complications: Secondary | ICD-10-CM

## 2020-11-14 MED ORDER — METFORMIN HCL 500 MG PO TABS: 500.0000 mg | ORAL_TABLET | Freq: Two times a day (BID) | ORAL | 0 refills | Status: DC

## 2020-11-14 NOTE — Telephone Encounter (Signed)
Medication Refill - Medication: metFORMIN (GLUCOPHAGE) 500 MG tablet (Patient is out of medication and needs a supply called into to pharmacy to hold him until appt on 11/29   Has the patient contacted their pharmacy?  yes (Agent: If no, request that the patient contact the pharmacy for the refill.) (Agent: If yes, when and what did the pharmacy advise?)Contact PCP  Preferred Pharmacy (with phone number or street name):  [id number] [place], [place] - [address] Phone:  [phone]  Fax:  [phone]       Agent: Please be advised that RX refills may take up to 3 business days. We ask that you follow-up with your pharmacy.

## 2020-11-19 ENCOUNTER — Telehealth (INDEPENDENT_AMBULATORY_CARE_PROVIDER_SITE_OTHER): Payer: Self-pay | Admitting: Registered Nurse

## 2020-11-19 ENCOUNTER — Other Ambulatory Visit: Payer: Self-pay

## 2020-11-19 ENCOUNTER — Encounter: Payer: Self-pay | Admitting: Registered Nurse

## 2020-11-19 ENCOUNTER — Ambulatory Visit: Payer: Self-pay | Admitting: Registered Nurse

## 2020-11-19 NOTE — Patient Instructions (Signed)
° ° ° °  If you have lab work done today you will be contacted with your lab results within the next 2 weeks.  If you have not heard from us then please contact us. The fastest way to get your results is to register for My Chart. ° ° °IF you received an x-ray today, you will receive an invoice from [facility]. Please contact [facility] at [phone] with questions or concerns regarding your invoice.  ° °IF you received labwork today, you will receive an invoice from [facility]. Please contact [facility] at [phone] with questions or concerns regarding your invoice.  ° °Our billing staff will not be able to assist you with questions regarding bills from these companies. ° °You will be contacted with the lab results as soon as they are available. The fastest way to get your results is to activate your My Chart account. Instructions are located on the last page of this paperwork. If you have not heard from us regarding the results in 2 weeks, please contact this office. °  ° ° ° °

## 2020-11-20 ENCOUNTER — Encounter: Payer: Self-pay | Admitting: Registered Nurse

## 2020-12-06 ENCOUNTER — Other Ambulatory Visit: Payer: Self-pay | Admitting: Registered Nurse

## 2020-12-06 DIAGNOSIS — E119 Type 2 diabetes mellitus without complications: Secondary | ICD-10-CM

## 2021-02-18 NOTE — Progress Notes (Signed)
Pt No Show

## 2021-03-11 LAB — HM DIABETES EYE EXAM

## 2021-07-29 ENCOUNTER — Other Ambulatory Visit: Payer: Self-pay

## 2021-07-29 ENCOUNTER — Ambulatory Visit (INDEPENDENT_AMBULATORY_CARE_PROVIDER_SITE_OTHER): Payer: Self-pay | Admitting: Registered Nurse

## 2021-07-29 ENCOUNTER — Encounter: Payer: Self-pay | Admitting: Registered Nurse

## 2021-07-29 VITALS — BP 106/69 | HR 65 | Temp 98.3°F | Resp 18 | Ht 72.0 in | Wt 264.6 lb

## 2021-07-29 DIAGNOSIS — E119 Type 2 diabetes mellitus without complications: Secondary | ICD-10-CM

## 2021-07-29 LAB — BASIC METABOLIC PANEL
BUN: 15 mg/dL (ref 6–23)
CO2: 26 mEq/L (ref 19–32)
Calcium: 9.4 mg/dL (ref 8.4–10.5)
Chloride: 102 mEq/L (ref 96–112)
Creatinine, Ser: 0.94 mg/dL (ref 0.40–1.50)
GFR: 99.4 mL/min (ref >60.00)
Glucose, Bld: 92 mg/dL (ref 70–99)
Potassium: 4 mEq/L (ref 3.5–5.1)
Sodium: 138 mEq/L (ref 135–145)

## 2021-07-29 LAB — LIPID PANEL
Cholesterol: 152 mg/dL (ref 0–200)
HDL: 31.8 mg/dL — ABNORMAL LOW (ref >39.00)
NonHDL: 119.81
Total CHOL/HDL Ratio: 5
Triglycerides: 214 mg/dL — ABNORMAL HIGH (ref 0.0–149.0)
VLDL: 42.8 mg/dL — ABNORMAL HIGH (ref 0.0–40.0)

## 2021-07-29 LAB — LDL CHOLESTEROL, DIRECT: Direct LDL: 98 mg/dL

## 2021-07-29 MED ORDER — GLIPIZIDE 5 MG PO TABS: 5.0000 mg | ORAL_TABLET | Freq: Every day | ORAL | 1 refills | Status: DC

## 2021-07-29 MED ORDER — METFORMIN HCL 1000 MG PO TABS: 1000.0000 mg | ORAL_TABLET | Freq: Two times a day (BID) | ORAL | 1 refills | Status: DC

## 2021-07-29 NOTE — Progress Notes (Signed)
Established Patient Office Visit  Subjective:  Patient ID: Ricky Whitney, male    DOB: [birth year]  Age: 45 y.o. MRN: [id number]  CC:  Chief Complaint  Patient presents with   Follow-up    Patient states he is here for follow up on diabetes and also elevated BP.    HPI Ricky Whitney presents for t2dm  Last A1c: 7.3 on 07/12/21 with outside provider. Currently taking: metformin 574m PO bid ac No new complications Reports good compliance with medications Diet has been improved Exercise habits have been steady   Past Medical History:  Diagnosis Date   Anxiety    Depression    Diabetes mellitus without complication ([facility]    Vaping-related disorder     Past Surgical History:  Procedure Laterality Date   ORIF ORBITAL FRACTURE Right 09/21/2019   Procedure: OPEN REDUCTION INTERNAL FIXATION (ORIF) RIGHT ZYGOMATICOMAXILLARY FRACTURE;  Surgeon: DMichael Litter DMD;  Location: [facility]  Service: Oral Surgery;  Laterality: Right;   wisdon teeth       Family History  Problem Relation Age of Onset   Diabetes Mellitus II Mother    Hypertension Mother    Heart attack Mother    Diabetes Mellitus II Father    Hypertension Father    Diabetes Mellitus II Brother    Hypertension Brother     Social History   Socioeconomic History   Marital status: Married    Spouse name: Not on file   Number of children: Not on file   Years of education: Not on file   Highest education level: Not on file  Occupational History   Not on file  Tobacco Use   Smoking status: Never   Smokeless tobacco: Never  Vaping Use   Vaping Use: Every day  Substance and Sexual Activity   Alcohol use: Not Currently    Comment: occassional   Drug use: Yes    Comment: vaping   Sexual activity: Not Currently  Other Topics Concern   Not on file  Social History Narrative   ** Merged History Encounter **       Social Determinants of Health   Financial Resource Strain: Not on file  Food  Insecurity: Not on file  Transportation Needs: Not on file  Physical Activity: Not on file  Stress: Not on file  Social Connections: Not on file  Intimate Partner Violence: Not on file    Outpatient Medications Prior to Visit  Medication Sig Dispense Refill   glucose monitoring kit (FREESTYLE) monitoring kit 1 each by Does not apply route as needed for other. Dispense any model that is covered- dispense testing supplies for Q TID AC/ HS accuchecks- 1 month supply with one refil. 1 each 1   Lancets (ONETOUCH DELICA PLUS [id number] MISC CHECK BLOOD SUGAR BEFORE MEALS AND AT BEDTIME     ONETOUCH VERIO test strip TEST BLOOD SUGAR BEFORE MEALS AND AT BEDTIME     FLUoxetine (PROZAC) 20 MG tablet TAKE 4 TABLETS BY MOUTH AT BEDTIME (Patient not taking: Reported on 07/29/2021) 360 tablet 1   metFORMIN (GLUCOPHAGE) 500 MG tablet Take 1 tablet (500 mg total) by mouth 2 (two) times daily with a meal. Please keep up coming appointment. 60 tablet 0   No facility-administered medications prior to visit.    No Known Allergies  ROS Review of Systems  Constitutional: Negative.   HENT: Negative.    Eyes: Negative.   Respiratory: Negative.    Cardiovascular: Negative.  Gastrointestinal: Negative.   Genitourinary: Negative.   Musculoskeletal: Negative.   Skin: Negative.   Neurological: Negative.   Psychiatric/Behavioral: Negative.    All other systems reviewed and are negative.    Objective:    Physical Exam Constitutional:      General: He is not in acute distress.    Appearance: Normal appearance. He is obese. He is not ill-appearing, toxic-appearing or diaphoretic.  Cardiovascular:     Rate and Rhythm: Normal rate and regular rhythm.     Heart sounds: Normal heart sounds. No murmur heard.   No friction rub. No gallop.  Pulmonary:     Effort: Pulmonary effort is normal. No respiratory distress.     Breath sounds: Normal breath sounds. No stridor. No wheezing, rhonchi or rales.  Chest:      Chest wall: No tenderness.  Neurological:     General: No focal deficit present.     Mental Status: He is alert and oriented to person, place, and time. Mental status is at baseline.  Psychiatric:        Mood and Affect: Mood normal.        Behavior: Behavior normal.        Thought Content: Thought content normal.        Judgment: Judgment normal.    BP 106/69   Pulse 65   Temp 98.3 F (36.8 C) (Temporal)   Resp 18   Ht 6' (1.829 m)   Wt 264 lb 9.6 oz (120 kg)   SpO2 99%   BMI 35.89 kg/m  Wt Readings from Last 3 Encounters:  07/29/21 264 lb 9.6 oz (120 kg)  11/19/20 270 lb (122.5 kg)  01/02/20 276 lb (125.2 kg)     Health Maintenance Due  Topic Date Due   HEMOGLOBIN A1C  07/01/2020   INFLUENZA VACCINE  07/22/2021    There are no preventive care reminders to display for this patient.  No results found for: TSH Lab Results  Component Value Date   WBC 9.4 09/11/2019   HGB 16.0 09/11/2019   HCT 45.6 09/11/2019   MCV 92.7 09/11/2019   PLT 259 09/11/2019   Lab Results  Component Value Date   NA 137 09/11/2019   K 4.3 09/11/2019   CO2 24 09/11/2019   GLUCOSE 217 (H) 09/11/2019   BUN 11 09/11/2019   CREATININE 0.98 09/11/2019   BILITOT 0.6 09/10/2019   ALKPHOS 102 09/10/2019   AST 30 09/10/2019   ALT 49 (H) 09/10/2019   PROT 7.3 09/10/2019   ALBUMIN 4.4 09/10/2019   CALCIUM 8.6 (L) 09/11/2019   ANIONGAP 8 09/11/2019   Lab Results  Component Value Date   CHOL 191 09/11/2019   Lab Results  Component Value Date   HDL 30 (L) 09/11/2019   Lab Results  Component Value Date   LDLCALC 131 (H) 09/11/2019   Lab Results  Component Value Date   TRIG 152 (H) 09/11/2019   Lab Results  Component Value Date   CHOLHDL 6.4 09/11/2019   Lab Results  Component Value Date   HGBA1C 7.1 (A) 01/02/2020      Assessment & Plan:   Problem List Items Addressed This Visit       Endocrine   Type 2 diabetes mellitus without complication, without long-term  current use of insulin (HCC) - Primary   Relevant Medications   glipiZIDE (GLUCOTROL) 5 MG tablet   metFORMIN (GLUCOPHAGE) 1000 MG tablet   Other Relevant Orders   Lipid panel  Basic Metabolic Panel (BMET)    Meds ordered this encounter  Medications   glipiZIDE (GLUCOTROL) 5 MG tablet    Sig: Take 1 tablet (5 mg total) by mouth daily before breakfast.    Dispense:  90 tablet    Refill:  1    Order Specific Question:   Supervising Provider    Answer:   Carlota Raspberry, JEFFREY R [[id number]]   metFORMIN (GLUCOPHAGE) 1000 MG tablet    Sig: Take 1 tablet (1,000 mg total) by mouth 2 (two) times daily with a meal.    Dispense:  180 tablet    Refill:  1    Order Specific Question:   Supervising Provider    Answer:   Carlota Raspberry, JEFFREY R [[id number]]    Follow-up: Return in about 6 months (around 01/29/2022) for t2dm.   PLAN A1c today at 7.3 - counting from last check about 3 weeks ago Can continue follow up q8moStart glipizide 579mPo qd ac Drawing bmp, lipids today as he has not had this done in too long. Will follow up as warranted Patient encouraged to call clinic with any questions, comments, or concerns.  RiMaximiano CossNP

## 2021-07-29 NOTE — Patient Instructions (Addendum)
Mr. Ricky Whitney -   [name] to see you  Glad things continue to be steady. Let's see if we can get A1c down to 6.5 by next visit. Glipizide 5mg  once daily with breakfast - if sugars drop below 80 stop taking this or split tabs in half.  I'll call with any relevant lab results.  See you in 6 mo, sooner if you need anything   Thank you    [name]      Meal recommendations:   [email or web address]  [email or web address]   [email or web address]  [email or web address]

## 2021-07-30 ENCOUNTER — Encounter: Payer: Self-pay | Admitting: Registered Nurse

## 2021-08-14 ENCOUNTER — Other Ambulatory Visit: Payer: Self-pay | Admitting: Registered Nurse

## 2021-08-21 ENCOUNTER — Telehealth: Payer: Self-pay | Admitting: Registered Nurse

## 2021-08-21 NOTE — Telephone Encounter (Signed)
Can someone call him to explain the lab results he received in the mail.

## 2021-08-21 NOTE — Telephone Encounter (Signed)
Patient received his blood work results and does not understand the results - can someone please call him to e

## 2021-10-13 NOTE — Telephone Encounter (Signed)
Have spoken with patient  Thanks  Ricky Whitney

## 2022-01-12 ENCOUNTER — Other Ambulatory Visit: Payer: Self-pay | Admitting: Registered Nurse

## 2022-01-12 DIAGNOSIS — E119 Type 2 diabetes mellitus without complications: Secondary | ICD-10-CM

## 2022-02-28 ENCOUNTER — Ambulatory Visit: Payer: Self-pay | Admitting: Registered Nurse

## 2022-02-28 ENCOUNTER — Ambulatory Visit (INDEPENDENT_AMBULATORY_CARE_PROVIDER_SITE_OTHER): Payer: Self-pay | Admitting: Registered Nurse

## 2022-02-28 ENCOUNTER — Other Ambulatory Visit: Payer: Self-pay

## 2022-02-28 ENCOUNTER — Encounter: Payer: Self-pay | Admitting: Registered Nurse

## 2022-02-28 VITALS — BP 122/68 | HR 70 | Temp 98.0°F | Resp 18 | Ht 72.0 in | Wt 265.0 lb

## 2022-02-28 DIAGNOSIS — E119 Type 2 diabetes mellitus without complications: Secondary | ICD-10-CM

## 2022-02-28 DIAGNOSIS — F32A Depression, unspecified: Secondary | ICD-10-CM

## 2022-02-28 LAB — POCT GLYCOSYLATED HEMOGLOBIN (HGB A1C): Hemoglobin A1C: 7.1 % — AB (ref 4.0–5.6)

## 2022-02-28 MED ORDER — GLIPIZIDE 5 MG PO TABS: 5.0000 mg | ORAL_TABLET | Freq: Every day | ORAL | 1 refills | Status: DC

## 2022-02-28 MED ORDER — METFORMIN HCL 1000 MG PO TABS: 1000.0000 mg | ORAL_TABLET | Freq: Two times a day (BID) | ORAL | 1 refills | Status: DC

## 2022-02-28 MED ORDER — FLUOXETINE HCL 20 MG PO CAPS: 20.0000 mg | ORAL_CAPSULE | Freq: Every day | ORAL | 0 refills | Status: DC

## 2022-02-28 NOTE — Patient Instructions (Addendum)
Ricky Whitney -  ? ?Steady as she goes ? ?Keep up the good work. Would love to see the A1c drop below 7 if possible.  ? ?Let's continue our 6 mo follow ups ? ?Call sooner if you need anything ? ?Thanks, ? ?Rich  ? ? ? ?If you have lab work done today you will be contacted with your lab results within the next 2 weeks.  If you have not heard from [place] then please contact us. The fastest way to get your results is to register for My Chart. ? ? ?IF you received an x-ray today, you will receive an invoice from [facility]. Please contact [facility] at [phone] with questions or concerns regarding your invoice.  ? ?IF you received labwork today, you will receive an invoice from [facility]. Please contact [facility] at [phone] with questions or concerns regarding your invoice.  ? ?Our billing staff will not be able to assist you with questions regarding bills from these companies. ? ?You will be contacted with the lab results as soon as they are available. The fastest way to get your results is to activate your My Chart account. Instructions are located on the last page of this paperwork. If you have not heard from [place] regarding the results in 2 weeks, please contact this office. ?  ? ? ?

## 2022-02-28 NOTE — Progress Notes (Signed)
? ?Established Patient Office Visit ? ?Subjective:  ?Patient ID: Ricky Whitney, male    DOB: 06/18/1976  Age: 46 y.o. MRN: [id number] ? ?CC:  ?Chief Complaint  ?Patient presents with  ?? Follow-up  ?  Patient states he is here to follow up on diabetes .  ? ? ?HPI ?Ricky Whitney presents for t2dm ? ?Last A1c:  ?Lab Results  ?Component Value Date  ? HGBA1C 7.1 (A) 02/28/2022  ?  ?Currently taking: glipizide 7m po qd, metformin 10014mpo bid ac.  ?No new complications ?Reports good compliance with medications ?Diet has been steady ?Exercise habits have been steady ? ?Depression/lability ?Has been on fluoxetine  ?Would like to restart ?More irritability lately. ? ?Past Medical History:  ?Diagnosis Date  ?? Anxiety   ?? Depression   ?? Diabetes mellitus without complication ([facility]  ?? Vaping-related disorder   ? ? ?Past Surgical History:  ?Procedure Laterality Date  ?? ORIF ORBITAL FRACTURE Right 09/21/2019  ? Procedure: OPEN REDUCTION INTERNAL FIXATION (ORIF) RIGHT ZYGOMATICOMAXILLARY FRACTURE;  Surgeon: DrMichael LitterDMD;  [facility] [facility] Service: Oral Surgery;  Laterality: Right;  ?? wisdon teeth     ? ? ?Family History  ?Problem Relation Age of Onset  ?? Diabetes Mellitus II Mother   ?? Hypertension Mother   ?? Heart attack Mother   ?? Diabetes Mellitus II Father   ?? Hypertension Father   ?? Diabetes Mellitus II Brother   ?? Hypertension Brother   ? ? ?Social History  ? ?Socioeconomic History  ?? Marital status: Married  ?  Spouse name: Not on file  ?? Number of children: Not on file  ?? Years of education: Not on file  ?? Highest education level: Not on file  ?Occupational History  ?? Not on file  ?Tobacco Use  ?? Smoking status: Never  ?? Smokeless tobacco: Never  ?Vaping Use  ?? Vaping Use: Every day  ?Substance and Sexual Activity  ?? Alcohol use: Not Currently  ?  Comment: occassional  ?? Drug use: Yes  ?  Comment: vaping  ?? Sexual activity: Not Currently  ?Other Topics Concern  ?? Not on  file  ?Social History Narrative  ? ** Merged History Encounter **  ?    ? ?Social Determinants of Health  ? ?Financial Resource Strain: Not on file  ?Food Insecurity: Not on file  ?Transportation Needs: Not on file  ?Physical Activity: Not on file  ?Stress: Not on file  ?Social Connections: Not on file  ?Intimate Partner Violence: Not on file  ? ? ?Outpatient Medications Prior to Visit  ?Medication Sig Dispense Refill  ?? glucose monitoring kit (FREESTYLE) monitoring kit 1 each by Does not apply route as needed for other. Dispense any model that is covered- dispense testing supplies for Q TID AC/ HS accuchecks- 1 month supply with one refil. 1 each 1  ?? Lancets (ONETOUCH DELICA PLUS [id number] CHECK BLOOD SUGAR BEFORE MEALS AND AT BEDTIME    ?? ONETOUCH VERIO test strip TEST BLOOD SUGAR BEFORE MEALS AND AT BEDTIME    ?? glipiZIDE (GLUCOTROL) 5 MG tablet Take 1 tablet (5 mg total) by mouth daily before breakfast. 90 tablet 1  ?? metFORMIN (GLUCOPHAGE) 1000 MG tablet Take 1 tablet (1,000 mg total) by mouth 2 (two) times daily with a meal. 180 tablet 1  ? ?No facility-administered medications prior to visit.  ? ? ?No Known Allergies ? ?ROS ?Review of Systems  ?Constitutional: Negative.   ?  HENT: Negative.    ?Eyes: Negative.   ?Respiratory: Negative.    ?Cardiovascular: Negative.   ?Gastrointestinal: Negative.   ?Genitourinary: Negative.   ?Musculoskeletal: Negative.   ?Skin: Negative.   ?Neurological: Negative.   ?Psychiatric/Behavioral: Negative.    ?All other systems reviewed and are negative. ? ?  ?Objective:  ?  ?Physical Exam ?Constitutional:   ?   General: He is not in acute distress. ?   Appearance: Normal appearance. He is normal weight. He is not ill-appearing, toxic-appearing or diaphoretic.  ?Cardiovascular:  ?   Rate and Rhythm: Normal rate and regular rhythm.  ?   Heart sounds: Normal heart sounds. No murmur heard. ?  No friction rub. No gallop.  ?Pulmonary:  ?   Effort: Pulmonary effort is normal.  No respiratory distress.  ?   Breath sounds: Normal breath sounds. No stridor. No wheezing, rhonchi or rales.  ?Chest:  ?   Chest wall: No tenderness.  ?Neurological:  ?   General: No focal deficit present.  ?   Mental Status: He is alert and oriented to person, place, and time. Mental status is at baseline.  ?Psychiatric:     ?   Mood and Affect: Mood normal.     ?   Behavior: Behavior normal.     ?   Thought Content: Thought content normal.     ?   Judgment: Judgment normal.  ? ? ?BP 122/68   Pulse 70   Temp 98 ?F (36.7 ?C) (Temporal)   Resp 18   Ht 6' (1.829 m)   Wt 265 lb (120.2 kg)   SpO2 98%   BMI 35.94 kg/m?  ?Wt Readings from Last 3 Encounters:  ?02/28/22 265 lb (120.2 kg)  ?07/29/21 264 lb 9.6 oz (120 kg)  ?11/19/20 270 lb (122.5 kg)  ? ? ? ?Health Maintenance Due  ?Topic Date Due  ?? URINE MICROALBUMIN  01/01/2021  ? ? ?There are no preventive care reminders to display for this patient. ? ?No results found for: TSH ?Lab Results  ?Component Value Date  ? WBC 9.4 09/11/2019  ? HGB 16.0 09/11/2019  ? HCT 45.6 09/11/2019  ? MCV 92.7 09/11/2019  ? PLT 259 09/11/2019  ? ?Lab Results  ?Component Value Date  ? NA 138 07/29/2021  ? K 4.0 07/29/2021  ? CO2 26 07/29/2021  ? GLUCOSE 92 07/29/2021  ? BUN 15 07/29/2021  ? CREATININE 0.94 07/29/2021  ? BILITOT 0.6 09/10/2019  ? ALKPHOS 102 09/10/2019  ? AST 30 09/10/2019  ? ALT 49 (H) 09/10/2019  ? PROT 7.3 09/10/2019  ? ALBUMIN 4.4 09/10/2019  ? CALCIUM 9.4 07/29/2021  ? ANIONGAP 8 09/11/2019  ? GFR 99.40 07/29/2021  ? ?Lab Results  ?Component Value Date  ? CHOL 152 07/29/2021  ? ?Lab Results  ?Component Value Date  ? HDL 31.80 (L) 07/29/2021  ? ?Lab Results  ?Component Value Date  ? LDLCALC 131 (H) 09/11/2019  ? ?Lab Results  ?Component Value Date  ? TRIG 214.0 (H) 07/29/2021  ? ?Lab Results  ?Component Value Date  ? CHOLHDL 5 07/29/2021  ? ?Lab Results  ?Component Value Date  ? HGBA1C 7.1 (A) 02/28/2022  ? ? ?  ?Assessment & Plan:  ? ?Problem List Items  Addressed This Visit   ? ?  ? Endocrine  ? Type 2 diabetes mellitus without complication, without long-term current use of insulin ([place]) - Primary  ? Relevant Medications  ? glipiZIDE (GLUCOTROL) 5 MG tablet  ? metFORMIN (  GLUCOPHAGE) 1000 MG tablet  ? Other Relevant Orders  ? POCT glycosylated hemoglobin (Hb A1C) (Completed)  ? Urine Microalbumin w/creat. ratio  ?  ? Other  ? Depression  ? Relevant Medications  ? FLUoxetine (PROZAC) 20 MG capsule  ? ? ?Meds ordered this encounter  ?Medications  ?? glipiZIDE (GLUCOTROL) 5 MG tablet  ?  Sig: Take 1 tablet (5 mg total) by mouth daily before breakfast.  ?  Dispense:  90 tablet  ?  Refill:  1  ?  Order Specific Question:   Supervising Provider  ?  Answer:   Carlota Raspberry, JEFFREY R [[id number]]  ?? metFORMIN (GLUCOPHAGE) 1000 MG tablet  ?  Sig: Take 1 tablet (1,000 mg total) by mouth 2 (two) times daily with a meal.  ?  Dispense:  180 tablet  ?  Refill:  1  ?  Order Specific Question:   Supervising Provider  ?  Answer:   Carlota Raspberry, JEFFREY R [[id number]]  ?? FLUoxetine (PROZAC) 20 MG capsule  ?  Sig: Take 1 capsule (20 mg total) by mouth daily.  ?  Dispense:  90 capsule  ?  Refill:  0  ?  Order Specific Question:   Supervising Provider  ?  Answer:   Carlota Raspberry, JEFFREY R [[id number]]  ? ? ?Follow-up: Return in about 6 months (around 08/31/2022) for t2dm.  ? ?PLAN ?A1c today at 7.1 ?Urine micro collected ?Continue current regimen ?Return in 6 mo ?Patient encouraged to call clinic with any questions, comments, or concerns. ? ?Maximiano Coss, NP ?

## 2022-05-26 ENCOUNTER — Other Ambulatory Visit: Payer: Self-pay | Admitting: Registered Nurse

## 2022-05-26 DIAGNOSIS — F32A Depression, unspecified: Secondary | ICD-10-CM

## 2022-05-26 NOTE — Telephone Encounter (Signed)
Patient is requesting a refill of the following medications: Requested Prescriptions   Pending Prescriptions Disp Refills   FLUoxetine (PROZAC) 20 MG capsule [Pharmacy Med Name: FLUOXETINE HCL 20 MG CAPSULE] 90 capsule 0    Sig: TAKE 1 CAPSULE BY MOUTH EVERY DAY    Date of patient request: 05/26/2022 Last office visit: 02/28/2022 Date of last refill: 02/28/2022 Last refill amount: 90 Follow up time period per chart: 09/01/2022

## 2022-09-01 ENCOUNTER — Ambulatory Visit: Payer: Self-pay | Admitting: Registered Nurse

## 2022-09-11 ENCOUNTER — Ambulatory Visit: Payer: Self-pay | Admitting: Family Medicine

## 2022-10-06 ENCOUNTER — Ambulatory Visit: Payer: Self-pay | Admitting: Emergency Medicine

## 2022-10-09 ENCOUNTER — Telehealth: Payer: Self-pay | Admitting: Registered Nurse

## 2022-10-09 DIAGNOSIS — E119 Type 2 diabetes mellitus without complications: Secondary | ICD-10-CM

## 2022-10-09 MED ORDER — METFORMIN HCL 1000 MG PO TABS: 1000.0000 mg | ORAL_TABLET | Freq: Two times a day (BID) | ORAL | 1 refills | Status: DC

## 2022-10-09 MED ORDER — GLIPIZIDE 5 MG PO TABS: 5.0000 mg | ORAL_TABLET | Freq: Every day | ORAL | 1 refills | Status: DC

## 2022-10-09 NOTE — Telephone Encounter (Signed)
Encourage patient to contact the pharmacy for refills or they can request refills through [facility]  (Please schedule appointment if patient has not been seen in over a year)    [place] TO: [id number] [place], [place] - [address] NAME & DOSE: Metformin 1000 mg and glipizide 5 mg  NOTES/COMMENTS FROM PATIENT: Pt has an appt with new pcp but pt can't be seen until Jan 2024. Pt is currently working out of town at the moment. Pt will not be back in town until after Thanksgiving. Pt want to know if he can get a refill until then . [facility] Send medication to pharmacy and pt will transfer himself. Pt don't know [facility] at the moment.       [place] office please notify patient: It takes 48-72 hours to process rx refill requests Ask patient to call pharmacy to ensure rx is ready before heading there.

## 2022-10-09 NOTE — Telephone Encounter (Signed)
Called and informed patient of refill sent in, did discuss when new NP will be starting

## 2022-12-23 ENCOUNTER — Ambulatory Visit: Payer: Self-pay | Admitting: Emergency Medicine

## 2023-03-16 ENCOUNTER — Encounter: Payer: Self-pay | Admitting: Emergency Medicine

## 2023-03-16 ENCOUNTER — Ambulatory Visit (INDEPENDENT_AMBULATORY_CARE_PROVIDER_SITE_OTHER): Payer: Self-pay | Admitting: Emergency Medicine

## 2023-03-16 ENCOUNTER — Telehealth: Payer: Self-pay | Admitting: Emergency Medicine

## 2023-03-16 VITALS — BP 122/76 | HR 82 | Temp 97.5°F | Ht 72.0 in | Wt 261.0 lb

## 2023-03-16 DIAGNOSIS — F32A Depression, unspecified: Secondary | ICD-10-CM

## 2023-03-16 DIAGNOSIS — E1165 Type 2 diabetes mellitus with hyperglycemia: Secondary | ICD-10-CM

## 2023-03-16 LAB — CBC WITH DIFFERENTIAL/PLATELET
Basophils Absolute: 0 10*3/uL (ref 0.0–0.1)
Basophils Relative: 0.5 % (ref 0.0–3.0)
Eosinophils Absolute: 0.2 10*3/uL (ref 0.0–0.7)
Eosinophils Relative: 3 % (ref 0.0–5.0)
HCT: 49.7 % (ref 39.0–52.0)
Hemoglobin: 17.5 g/dL — ABNORMAL HIGH (ref 13.0–17.0)
Lymphocytes Relative: 21.5 % (ref 12.0–46.0)
Lymphs Abs: 1.3 10*3/uL (ref 0.7–4.0)
MCHC: 35.3 g/dL (ref 30.0–36.0)
MCV: 90.7 fl (ref 78.0–100.0)
Monocytes Absolute: 0.5 10*3/uL (ref 0.1–1.0)
Monocytes Relative: 8.3 % (ref 3.0–12.0)
Neutro Abs: 3.9 10*3/uL (ref 1.4–7.7)
Neutrophils Relative %: 66.7 % (ref 43.0–77.0)
Platelets: 225 10*3/uL (ref 150.0–400.0)
RBC: 5.48 Mil/uL (ref 4.22–5.81)
RDW: 12.2 % (ref 11.5–15.5)
WBC: 5.9 10*3/uL (ref 4.0–10.5)

## 2023-03-16 LAB — POCT GLYCOSYLATED HEMOGLOBIN (HGB A1C): Hemoglobin A1C: 7.3 % — AB (ref 4.0–5.6)

## 2023-03-16 LAB — COMPREHENSIVE METABOLIC PANEL
ALT: 73 U/L — ABNORMAL HIGH (ref 0–53)
AST: 30 U/L (ref 0–37)
Albumin: 4.5 g/dL (ref 3.5–5.2)
Alkaline Phosphatase: 71 U/L (ref 39–117)
BUN: 13 mg/dL (ref 6–23)
CO2: 25 mEq/L (ref 19–32)
Calcium: 9.2 mg/dL (ref 8.4–10.5)
Chloride: 99 mEq/L (ref 96–112)
Creatinine, Ser: 0.98 mg/dL (ref 0.40–1.50)
GFR: 93.48 mL/min (ref >60.00)
Glucose, Bld: 157 mg/dL — ABNORMAL HIGH (ref 70–99)
Potassium: 3.8 mEq/L (ref 3.5–5.1)
Sodium: 134 mEq/L — ABNORMAL LOW (ref 135–145)
Total Bilirubin: 0.6 mg/dL (ref 0.2–1.2)
Total Protein: 7.2 g/dL (ref 6.0–8.3)

## 2023-03-16 LAB — LIPID PANEL
Cholesterol: 144 mg/dL (ref 0–200)
HDL: 35 mg/dL — ABNORMAL LOW (ref >39.00)
LDL Cholesterol: 81 mg/dL (ref 0–99)
NonHDL: 108.59
Total CHOL/HDL Ratio: 4
Triglycerides: 140 mg/dL (ref 0.0–149.0)
VLDL: 28 mg/dL (ref 0.0–40.0)

## 2023-03-16 LAB — MICROALBUMIN / CREATININE URINE RATIO
Creatinine,U: 142.1 mg/dL
Microalb Creat Ratio: 2.3 mg/g (ref 0.0–30.0)
Microalb, Ur: 3.3 mg/dL — ABNORMAL HIGH (ref 0.0–1.9)

## 2023-03-16 MED ORDER — GLIPIZIDE 5 MG PO TABS: 5.0000 mg | ORAL_TABLET | Freq: Two times a day (BID) | ORAL | 3 refills | Status: DC

## 2023-03-16 MED ORDER — FLUOXETINE HCL 20 MG PO CAPS: 20.0000 mg | ORAL_CAPSULE | Freq: Every day | ORAL | 3 refills | Status: DC

## 2023-03-16 MED ORDER — METFORMIN HCL 500 MG PO TABS: 500.0000 mg | ORAL_TABLET | Freq: Two times a day (BID) | ORAL | 3 refills | Status: DC

## 2023-03-16 NOTE — Progress Notes (Signed)
Ricky Whitney 47 y.o.   Chief Complaint  Patient presents with  . New Patient (Initial Visit)    Patient has issues with diarrhea, ED issues as well     HISTORY OF PRESENT ILLNESS: This is a 47 y.o. male first visit to this office, here to establish care with me, former patient of Maximiano Coss. History of diabetes, presently on metformin 1000 mg twice a day and glipizide 5 mg once a day Complaining of diarrhea since starting metformin.  When he is off metformin stool go back to normal Complaining of erectile dysfunction problems but does not want to take Viagra because it is too expensive Lab Results  Component Value Date   HGBA1C 7.1 (A) 02/28/2022  History of depression.  Wants to go back on Prozac.  HPI   Prior to Admission medications   Medication Sig Start Date End Date Taking? Authorizing Provider  FLUoxetine (PROZAC) 20 MG capsule TAKE 1 CAPSULE BY MOUTH EVERY DAY 05/27/22   Maximiano Coss, NP  glipiZIDE (GLUCOTROL) 5 MG tablet Take 1 tablet (5 mg total) by mouth daily before breakfast. 10/09/22   Wendie Agreste, MD  glucose monitoring kit (FREESTYLE) monitoring kit 1 each by Does not apply route as needed for other. Dispense any model that is covered- dispense testing supplies for Q TID AC/ HS accuchecks- 1 month supply with one refil. 09/11/19   Debbe Odea, MD  Lancets (ONETOUCH DELICA PLUS [id number]) [place] CHECK BLOOD SUGAR BEFORE MEALS AND AT BEDTIME 10/05/19   [provider]  metFORMIN (GLUCOPHAGE) 1000 MG tablet Take 1 tablet (1,000 mg total) by mouth 2 (two) times daily with a meal. 10/09/22   Wendie Agreste, MD  [facility] VERIO test strip TEST BLOOD SUGAR BEFORE MEALS AND AT BEDTIME 10/07/19   [provider]    No Known Allergies  Patient Active Problem List   Diagnosis Date Noted  . 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 Type 2 diabetes mellitus without complication, without long-term current use of insulin ([place]) 09/10/2019  .  Depression 07/17/2013    Past Medical History:  Diagnosis Date  . Anxiety   . Depression   . Diabetes mellitus without complication ([place])   . Vaping-related disorder     Past Surgical History:  Procedure Laterality Date  . ORIF ORBITAL FRACTURE Right 09/21/2019   Procedure: OPEN REDUCTION INTERNAL FIXATION (ORIF) RIGHT ZYGOMATICOMAXILLARY FRACTURE;  Surgeon: Michael Litter, DMD;  Location: [facility];  Service: Oral Surgery;  Laterality: Right;  . wisdon teeth       Social History   Socioeconomic History  . Marital status: Married    Spouse name: Not on file  . Number of children: Not on file  . Years of education: Not on file  . Highest education level: Not on file  Occupational History  . Not on file  Tobacco Use  . Smoking status: Never  . Smokeless tobacco: Never  Vaping Use  . Vaping Use: Every day  Substance and Sexual Activity  . Alcohol use: Not Currently    Comment: occassional  . Drug use: Yes    Comment: vaping  . Sexual activity: Not Currently  Other Topics Concern  . Not on file  Social History Narrative   ** Merged History Encounter **       Social Determinants of Health   Financial Resource Strain: Not on file  Food Insecurity: Not on file  Transportation Needs: Not on file  Physical Activity: Not on file  Stress: Not on file  Social Connections: Not on file  Intimate Partner Violence: Not on file    Family History  Problem Relation Age of Onset  . Diabetes Mellitus II Mother   . Hypertension Mother   . Heart attack Mother   . Diabetes Mellitus II Father   . Hypertension Father   . Diabetes Mellitus II Brother   . Hypertension Brother      Review of Systems  Constitutional: Negative.  Negative for chills and fever.  HENT: Negative.  Negative for congestion and sore throat.   Respiratory: Negative.  Negative for cough and shortness of breath.   Cardiovascular: Negative.  Negative for chest pain and palpitations.   Gastrointestinal:  Positive for diarrhea. Negative for abdominal pain, nausea and vomiting.  Genitourinary:        Erectile dysfunction  Skin: Negative.  Negative for rash.  Neurological: Negative.  Negative for dizziness and headaches.  Psychiatric/Behavioral:  Positive for depression.   All other systems reviewed and are negative.   Today's Vitals   03/16/23 0813  BP: 122/76  Pulse: 82  Temp: (!) 97.5 F (36.4 C)  TempSrc: Oral  SpO2: 98%  Weight: 261 lb (118.4 kg)  Height: 6' (1.829 m)   Body mass index is 35.4 kg/m.   Physical Exam Vitals reviewed.  Constitutional:      Appearance: Normal appearance.  HENT:     Head: Normocephalic.     Mouth/Throat:     Mouth: Mucous membranes are moist.     Pharynx: Oropharynx is clear.  Eyes:     Extraocular Movements: Extraocular movements intact.     Conjunctiva/sclera: Conjunctivae normal.     Pupils: Pupils are equal, round, and reactive to light.  Cardiovascular:     Rate and Rhythm: Normal rate and regular rhythm.     Pulses: Normal pulses.     Heart sounds: Normal heart sounds.  Pulmonary:     Effort: Pulmonary effort is normal.     Breath sounds: Normal breath sounds.  Musculoskeletal:     Cervical back: No tenderness.  Lymphadenopathy:     Cervical: No cervical adenopathy.  Skin:    General: Skin is warm and dry.     Capillary Refill: Capillary refill takes less than 2 seconds.  Neurological:     General: No focal deficit present.     Mental Status: He is alert and oriented to person, place, and time.  Psychiatric:        Mood and Affect: Mood normal.        Behavior: Behavior normal.   Results for orders placed or performed in visit on 03/16/23 (from the past 24 hour(s))  POCT glycosylated hemoglobin (Hb A1C)     Status: Abnormal   Collection Time: 03/16/23  8:40 AM  Result Value Ref Range   Hemoglobin A1C 7.3 (A) 4.0 - 5.6 %   HbA1c POC (<> result, manual entry)     HbA1c, POC (prediabetic range)      HbA1c, POC (controlled diabetic range)       ASSESSMENT & PLAN: A total of 47 minutes was spent with the patient and counseling/coordination of care regarding preparing for this visit, review of most recent office visit notes, comprehensive history and physical examination, diagnosis of diabetes and cardiovascular risks associated with this condition, review of all medications and changes made, review of most recent blood work results including interpretation of today's hemoglobin A1c, education on nutrition, prognosis, documentation and need for follow-up.  Problem List Items Addressed This Visit       Endocrine   Diabetes mellitus with hyperglycemia ([place]) - Primary    Hemoglobin A1c at 7.3, still not at goal Metformin creating diarrhea Recommend to decrease metformin dose to 500 mg twice a day and increase glipizide to 5 mg twice a day Diet and nutrition discussed Cardiovascular risks associated with diabetes discussed Follow-up in 3 months       Relevant Medications   FLUoxetine (PROZAC) 20 MG capsule   metFORMIN (GLUCOPHAGE) 500 MG tablet   glipiZIDE (GLUCOTROL) 5 MG tablet   Other Relevant Orders   POCT glycosylated hemoglobin (Hb A1C) (Completed)   Urine Microalbumin w/creat. ratio   CBC with Differential/Platelet   Comprehensive metabolic panel   Lipid panel     Other   Depression    Currently active.  Not taking Prozac at present time but wants to go back on it.  Work related stress issues. Recommend to start Prozac 20 mg daily.       Relevant Medications   FLUoxetine (PROZAC) 20 MG capsule   Patient Instructions  Decrease dose of metformin to 500 mg twice a day Increase glipizide to 5 mg twice a day Start Prozac 20 mg daily Follow-up in 3 months  Diabetes Mellitus and Nutrition, Adult When you have diabetes, or diabetes mellitus, it is very important to have healthy eating habits because your blood sugar (glucose) levels are greatly affected by what you eat  and drink. Eating healthy foods in the right amounts, at about the same times every day, can help you: Manage your blood glucose. Lower your risk of heart disease. Improve your blood pressure. Reach or maintain a healthy weight. What can affect my meal plan? Every person with diabetes is different, and each person has different needs for a meal plan. Your health care provider may recommend that you work with a dietitian to make a meal plan that is best for you. Your meal plan may vary depending on factors such as: The calories you need. The medicines you take. Your weight. Your blood glucose, blood pressure, and cholesterol levels. Your activity level. Other health conditions you have, such as heart or kidney disease. How do carbohydrates affect me? Carbohydrates, also called carbs, affect your blood glucose level more than any other type of food. Eating carbs raises the amount of glucose in your blood. It is important to know how many carbs you can safely have in each meal. This is different for every person. Your dietitian can help you calculate how many carbs you should have at each meal and for each snack. How does alcohol affect me? Alcohol can cause a decrease in blood glucose (hypoglycemia), especially if you use insulin or take certain diabetes medicines by mouth. Hypoglycemia can be a life-threatening condition. Symptoms of hypoglycemia, such as sleepiness, dizziness, and confusion, are similar to symptoms of having too much alcohol. Do not drink alcohol if: Your health care provider tells you not to drink. You are pregnant, may be pregnant, or are planning to become pregnant. If you drink alcohol: Limit how much you have to: 0-1 drink a day for women. 0-2 drinks a day for men. Know how much alcohol is in your drink. In the [place] one drink equals one 12 oz bottle of beer (355 mL), one 5 oz glass of wine (148 mL), or one 1 oz glass of hard liquor (44 mL). Keep yourself hydrated  with water, diet soda, or unsweetened iced tea.  Keep in mind that regular soda, juice, and other mixers may contain a lot of sugar and must be counted as carbs. What are tips for following this plan?  Reading food labels Start by checking the serving size on the Nutrition Facts label of packaged foods and drinks. The number of calories and the amount of carbs, fats, and other nutrients listed on the label are based on one serving of the item. Many items contain more than one serving per package. Check the total grams (g) of carbs in one serving. Check the number of grams of saturated fats and trans fats in one serving. Choose foods that have a low amount or none of these fats. Check the number of milligrams (mg) of salt (sodium) in one serving. Most people should limit total sodium intake to less than 2,300 mg per day. Always check the nutrition information of foods labeled as "low-fat" or "nonfat." These foods may be higher in added sugar or refined carbs and should be avoided. Talk to your dietitian to identify your daily goals for nutrients listed on the label. Shopping Avoid buying canned, pre-made, or processed foods. These foods tend to be high in fat, sodium, and added sugar. Shop around the outside edge of the grocery store. This is where you will most often find fresh fruits and vegetables, bulk grains, fresh meats, and fresh dairy products. Cooking Use low-heat cooking methods, such as baking, instead of high-heat cooking methods, such as deep frying. Cook using healthy oils, such as olive, canola, or sunflower oil. Avoid cooking with butter, cream, or high-fat meats. Meal planning Eat meals and snacks regularly, preferably at the same times every day. Avoid going long periods of time without eating. Eat foods that are high in fiber, such as fresh fruits, vegetables, beans, and whole grains. Eat 4-6 oz (112-168 g) of lean protein each day, such as lean meat, chicken, fish, eggs, or  tofu. One ounce (oz) (28 g) of lean protein is equal to: 1 oz (28 g) of meat, chicken, or fish. 1 egg.  cup (62 g) of tofu. Eat some foods each day that contain healthy fats, such as avocado, nuts, seeds, and fish. What foods should I eat? Fruits Berries. Apples. Oranges. Peaches. Apricots. Plums. Grapes. Mangoes. Papayas. Pomegranates. Kiwi. Cherries. Vegetables Leafy greens, including lettuce, spinach, kale, chard, collard greens, mustard greens, and cabbage. Beets. Cauliflower. Broccoli. Carrots. Green beans. Tomatoes. Peppers. Onions. Cucumbers. Brussels sprouts. Grains Whole grains, such as whole-wheat or whole-grain bread, crackers, tortillas, cereal, and pasta. Unsweetened oatmeal. Quinoa. Brown or wild rice. Meats and other proteins Seafood. Poultry without skin. Lean cuts of poultry and beef. Tofu. Nuts. Seeds. Dairy Low-fat or fat-free dairy products such as milk, yogurt, and cheese. The items listed above may not be a complete list of foods and beverages you can eat and drink. Contact a dietitian for more information. What foods should I avoid? Fruits Fruits canned with syrup. Vegetables Canned vegetables. Frozen vegetables with butter or cream sauce. Grains Refined white flour and flour products such as bread, pasta, snack foods, and cereals. Avoid all processed foods. Meats and other proteins Fatty cuts of meat. Poultry with skin. Breaded or fried meats. Processed meat. Avoid saturated fats. Dairy Full-fat yogurt, cheese, or milk. Beverages Sweetened drinks, such as soda or iced tea. The items listed above may not be a complete list of foods and beverages you should avoid. Contact a dietitian for more information. Questions to ask a health care provider Do I need  to meet with a certified diabetes care and education specialist? Do I need to meet with a dietitian? What number can I call if I have questions? When are the best times to check my blood glucose? Where to  find more information: American Diabetes Association: diabetes.org Academy of Nutrition and Dietetics: eatright.[employer] of Diabetes and Digestive and Kidney Diseases: [employer].is Association of Diabetes Care & Education Specialists: diabeteseducator.org Summary It is important to have healthy eating habits because your blood sugar (glucose) levels are greatly affected by what you eat and drink. It is important to use alcohol carefully. A healthy meal plan will help you manage your blood glucose and lower your risk of heart disease. Your health care provider may recommend that you work with a dietitian to make a meal plan that is best for you. This information is not intended to replace advice given to you by your health care provider. Make sure you discuss any questions you have with your health care provider. Document Revised: 07/11/2020 Document Reviewed: 07/11/2020 Elsevier Patient Education  [place], MD [facility]

## 2023-03-16 NOTE — Assessment & Plan Note (Signed)
Currently active.  Not taking Prozac at present time but wants to go back on it.  Work related stress issues. Recommend to start Prozac 20 mg daily.

## 2023-03-16 NOTE — Telephone Encounter (Signed)
Patient returned Don's call about his lab results. He would like a call back at [phone].

## 2023-03-16 NOTE — Patient Instructions (Signed)
Decrease dose of metformin to 500 mg twice a day Increase glipizide to 5 mg twice a day Start Prozac 20 mg daily Follow-up in 3 months  Diabetes Mellitus and Nutrition, Adult When you have diabetes, or diabetes mellitus, it is very important to have healthy eating habits because your blood sugar (glucose) levels are greatly affected by what you eat and drink. Eating healthy foods in the right amounts, at about the same times every day, can help you: Manage your blood glucose. Lower your risk of heart disease. Improve your blood pressure. Reach or maintain a healthy weight. What can affect my meal plan? Every person with diabetes is different, and each person has different needs for a meal plan. Your health care provider may recommend that you work with a dietitian to make a meal plan that is best for you. Your meal plan may vary depending on factors such as: The calories you need. The medicines you take. Your weight. Your blood glucose, blood pressure, and cholesterol levels. Your activity level. Other health conditions you have, such as heart or kidney disease. How do carbohydrates affect me? Carbohydrates, also called carbs, affect your blood glucose level more than any other type of food. Eating carbs raises the amount of glucose in your blood. It is important to know how many carbs you can safely have in each meal. This is different for every person. Your dietitian can help you calculate how many carbs you should have at each meal and for each snack. How does alcohol affect me? Alcohol can cause a decrease in blood glucose (hypoglycemia), especially if you use insulin or take certain diabetes medicines by mouth. Hypoglycemia can be a life-threatening condition. Symptoms of hypoglycemia, such as sleepiness, dizziness, and confusion, are similar to symptoms of having too much alcohol. Do not drink alcohol if: Your health care provider tells you not to drink. You are pregnant, may be  pregnant, or are planning to become pregnant. If you drink alcohol: Limit how much you have to: 0-1 drink a day for women. 0-2 drinks a day for men. Know how much alcohol is in your drink. In the [place] one drink equals one 12 oz bottle of beer (355 mL), one 5 oz glass of wine (148 mL), or one 1 oz glass of hard liquor (44 mL). Keep yourself hydrated with water, diet soda, or unsweetened iced tea. Keep in mind that regular soda, juice, and other mixers may contain a lot of sugar and must be counted as carbs. What are tips for following this plan?  Reading food labels Start by checking the serving size on the Nutrition Facts label of packaged foods and drinks. The number of calories and the amount of carbs, fats, and other nutrients listed on the label are based on one serving of the item. Many items contain more than one serving per package. Check the total grams (g) of carbs in one serving. Check the number of grams of saturated fats and trans fats in one serving. Choose foods that have a low amount or none of these fats. Check the number of milligrams (mg) of salt (sodium) in one serving. Most people should limit total sodium intake to less than 2,300 mg per day. Always check the nutrition information of foods labeled as "low-fat" or "nonfat." These foods may be higher in added sugar or refined carbs and should be avoided. Talk to your dietitian to identify your daily goals for nutrients listed on the label. Shopping Avoid buying canned, pre-made,  or processed foods. These foods tend to be high in fat, sodium, and added sugar. Shop around the outside edge of the grocery store. This is where you will most often find fresh fruits and vegetables, bulk grains, fresh meats, and fresh dairy products. Cooking Use low-heat cooking methods, such as baking, instead of high-heat cooking methods, such as deep frying. Cook using healthy oils, such as olive, canola, or sunflower oil. Avoid cooking with  butter, cream, or high-fat meats. Meal planning Eat meals and snacks regularly, preferably at the same times every day. Avoid going long periods of time without eating. Eat foods that are high in fiber, such as fresh fruits, vegetables, beans, and whole grains. Eat 4-6 oz (112-168 g) of lean protein each day, such as lean meat, chicken, fish, eggs, or tofu. One ounce (oz) (28 g) of lean protein is equal to: 1 oz (28 g) of meat, chicken, or fish. 1 egg.  cup (62 g) of tofu. Eat some foods each day that contain healthy fats, such as avocado, nuts, seeds, and fish. What foods should I eat? Fruits Berries. Apples. Oranges. Peaches. Apricots. Plums. Grapes. Mangoes. Papayas. Pomegranates. Kiwi. Cherries. Vegetables Leafy greens, including lettuce, spinach, kale, chard, collard greens, mustard greens, and cabbage. Beets. Cauliflower. Broccoli. Carrots. Green beans. Tomatoes. Peppers. Onions. Cucumbers. Brussels sprouts. Grains Whole grains, such as whole-wheat or whole-grain bread, crackers, tortillas, cereal, and pasta. Unsweetened oatmeal. Quinoa. Brown or wild rice. Meats and other proteins Seafood. Poultry without skin. Lean cuts of poultry and beef. Tofu. Nuts. Seeds. Dairy Low-fat or fat-free dairy products such as milk, yogurt, and cheese. The items listed above may not be a complete list of foods and beverages you can eat and drink. Contact a dietitian for more information. What foods should I avoid? Fruits Fruits canned with syrup. Vegetables Canned vegetables. Frozen vegetables with butter or cream sauce. Grains Refined white flour and flour products such as bread, pasta, snack foods, and cereals. Avoid all processed foods. Meats and other proteins Fatty cuts of meat. Poultry with skin. Breaded or fried meats. Processed meat. Avoid saturated fats. Dairy Full-fat yogurt, cheese, or milk. Beverages Sweetened drinks, such as soda or iced tea. The items listed above may not be a  complete list of foods and beverages you should avoid. Contact a dietitian for more information. Questions to ask a health care provider Do I need to meet with a certified diabetes care and education specialist? Do I need to meet with a dietitian? What number can I call if I have questions? When are the best times to check my blood glucose? Where to find more information: American Diabetes Association: diabetes.org Academy of Nutrition and Dietetics: eatright.[employer] of Diabetes and Digestive and Kidney Diseases: [employer].is Association of Diabetes Care & Education Specialists: diabeteseducator.org Summary It is important to have healthy eating habits because your blood sugar (glucose) levels are greatly affected by what you eat and drink. It is important to use alcohol carefully. A healthy meal plan will help you manage your blood glucose and lower your risk of heart disease. Your health care provider may recommend that you work with a dietitian to make a meal plan that is best for you. This information is not intended to replace advice given to you by your health care provider. Make sure you discuss any questions you have with your health care provider. Document Revised: 07/11/2020 Document Reviewed: 07/11/2020 Elsevier Patient Education  [place].

## 2023-03-16 NOTE — Assessment & Plan Note (Signed)
Hemoglobin A1c at 7.3, still not at goal Metformin creating diarrhea Recommend to decrease metformin dose to 500 mg twice a day and increase glipizide to 5 mg twice a day Diet and nutrition discussed Cardiovascular risks associated with diabetes discussed Follow-up in 3 months

## 2023-03-17 NOTE — Telephone Encounter (Signed)
Called patient and informed him of his lab results.

## 2023-06-15 ENCOUNTER — Ambulatory Visit (INDEPENDENT_AMBULATORY_CARE_PROVIDER_SITE_OTHER): Payer: 59 | Admitting: Emergency Medicine

## 2023-06-15 ENCOUNTER — Encounter: Payer: Self-pay | Admitting: Emergency Medicine

## 2023-06-15 VITALS — BP 120/70 | HR 71 | Temp 97.6°F | Ht 72.0 in | Wt 276.1 lb

## 2023-06-15 DIAGNOSIS — F3341 Major depressive disorder, recurrent, in partial remission: Secondary | ICD-10-CM | POA: Insufficient documentation

## 2023-06-15 DIAGNOSIS — Z7984 Long term (current) use of oral hypoglycemic drugs: Secondary | ICD-10-CM

## 2023-06-15 DIAGNOSIS — E1165 Type 2 diabetes mellitus with hyperglycemia: Secondary | ICD-10-CM

## 2023-06-15 DIAGNOSIS — Z6841 Body Mass Index (BMI) 40.0 and over, adult: Secondary | ICD-10-CM | POA: Diagnosis not present

## 2023-06-15 LAB — POCT GLYCOSYLATED HEMOGLOBIN (HGB A1C): Hemoglobin A1C: 8.7 % — AB (ref 4.0–5.6)

## 2023-06-15 MED ORDER — ROSUVASTATIN CALCIUM 10 MG PO TABS
10.0000 mg | ORAL_TABLET | Freq: Every day | ORAL | 3 refills | Status: DC
Start: 2023-06-15 — End: 2024-07-03

## 2023-06-15 MED ORDER — TIRZEPATIDE 5 MG/0.5ML ~~LOC~~ SOAJ
5.0000 mg | SUBCUTANEOUS | 3 refills | Status: DC
Start: 2023-06-15 — End: 2024-01-25

## 2023-06-15 NOTE — Assessment & Plan Note (Signed)
Stable and well-controlled Continue Prozac 20 mg daily

## 2023-06-15 NOTE — Progress Notes (Signed)
Ricky Whitney 47 y.o.   Chief Complaint  Patient presents with   Medical Management of Chronic Issues    f/u appt, no concerns     HISTORY OF PRESENT ILLNESS: This is a 47 y.o. male here for 27-month follow-up of diabetes Presently taking metformin 500 mg twice a day and glipizide 5 mg twice a day States sugars at home have been in the 200 range No other complaints or medical concerns today.  HPI   Prior to Admission medications   Medication Sig Start Date End Date Taking? Authorizing Provider  FLUoxetine (PROZAC) 20 MG capsule Take 1 capsule (20 mg total) by mouth daily. 03/16/23  Yes Darris Staiger, Eilleen Kempf, MD  glipiZIDE (GLUCOTROL) 5 MG tablet Take 1 tablet (5 mg total) by mouth 2 (two) times daily before a meal. 03/16/23 03/10/24 Yes Lakesia Dahle, Eilleen Kempf, MD  glucose monitoring kit (FREESTYLE) monitoring kit 1 each by Does not apply route as needed for other. Dispense any model that is covered- dispense testing supplies for Q TID AC/ HS accuchecks- 1 month supply with one refil. 09/11/19  Yes Calvert Cantor, MD  Lancets (ONETOUCH DELICA PLUS LANCET30G) MISC CHECK BLOOD SUGAR BEFORE MEALS AND AT BEDTIME 10/05/19  Yes [provider]  metFORMIN (GLUCOPHAGE) 500 MG tablet Take 1 tablet (500 mg total) by mouth 2 (two) times daily with a meal. 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   Type 2 diabetes mellitus without complication, without long-term current use of insulin (HCC) 09/10/2019   Depression 07/17/2013    Past Medical History:  Diagnosis Date   Anxiety    Depression    Diabetes mellitus without complication (HCC)    Vaping-related disorder     Past Surgical History:  Procedure  Laterality Date   ORIF ORBITAL FRACTURE Right 09/21/2019   Procedure: OPEN REDUCTION INTERNAL FIXATION (ORIF) RIGHT ZYGOMATICOMAXILLARY FRACTURE;  Surgeon: Vivia Ewing, DMD;  Location: [facility];  Service: Oral Surgery;  Laterality: Right;   wisdon teeth       Social History   Socioeconomic History   Marital status: Married    Spouse name: Not on file   Number of children: Not on file   Years of education: Not on file   Highest education level: Not on file  Occupational History   Not on file  Tobacco Use   Smoking status: Never   Smokeless tobacco: Never  Vaping Use   Vaping Use: Every day  Substance and Sexual Activity   Alcohol use: Not Currently    Comment: occassional   Drug use: Yes    Comment: vaping   Sexual activity: Not Currently  Other Topics Concern   Not on file  Social History Narrative   ** Merged History Encounter **       Social Determinants of Health   Financial Resource Strain: Not on file  Food Insecurity: Not on file  Transportation Needs: Not on file  Physical Activity: Not on file  Stress: Not on file  Social Connections: Not on file  Intimate Partner Violence: Not on file    Family History  Problem Relation Age of Onset   Diabetes Mellitus II Mother    Hypertension Mother  Heart attack Mother    Diabetes Mellitus II Father    Hypertension Father    Diabetes Mellitus II Brother    Hypertension Brother      Review of Systems  Constitutional: Negative.  Negative for chills and fever.  HENT: Negative.  Negative for congestion and sore throat.   Respiratory: Negative.  Negative for cough and shortness of breath.   Cardiovascular: Negative.  Negative for chest pain and palpitations.  Gastrointestinal:  Negative for abdominal pain, diarrhea, nausea and vomiting.  Genitourinary: Negative.  Negative for dysuria and hematuria.  Musculoskeletal: Negative.   Skin: Negative.  Negative for rash.  All other systems reviewed  and are negative.   Vitals:   06/15/23 0843  BP: 120/70  Pulse: 71  Temp: 97.6 F (36.4 C)  SpO2: 96%    Physical Exam Vitals reviewed.  Constitutional:      Appearance: Normal appearance.  HENT:     Head: Normocephalic.     Mouth/Throat:     Mouth: Mucous membranes are moist.     Pharynx: Oropharynx is clear.  Eyes:     Extraocular Movements: Extraocular movements intact.     Pupils: Pupils are equal, round, and reactive to light.  Cardiovascular:     Rate and Rhythm: Normal rate and regular rhythm.     Pulses: Normal pulses.     Heart sounds: Normal heart sounds.  Pulmonary:     Effort: Pulmonary effort is normal.     Breath sounds: Normal breath sounds.  Musculoskeletal:     Cervical back: No tenderness.  Lymphadenopathy:     Cervical: No cervical adenopathy.  Skin:    General: Skin is warm and dry.     Capillary Refill: Capillary refill takes less than 2 seconds.  Neurological:     General: No focal deficit present.     Mental Status: He is alert and oriented to person, place, and time.  Psychiatric:        Mood and Affect: Mood normal.        Behavior: Behavior normal.    Results for orders placed or performed in visit on 06/15/23 (from the past 24 hour(s))  POCT HgB A1C     Status: Abnormal   Collection Time: 06/15/23  9:59 AM  Result Value Ref Range   Hemoglobin A1C 8.7 (A) 4.0 - 5.6 %   HbA1c POC (<> result, manual entry)     HbA1c, POC (prediabetic range)     HbA1c, POC (controlled diabetic range)       ASSESSMENT & PLAN: A total of 44 minutes was spent with the patient and counseling/coordination of care regarding preparing for this visit, review of most recent office visit notes, review of most recent blood work results including interpretation of today's hemoglobin A1c, cardiovascular risks associated with uncontrolled diabetes, review of all medications and changes made, education on nutrition, prognosis, documentation, need for  follow-up  Problem List Items Addressed This Visit       Endocrine   Diabetes mellitus with hyperglycemia (HCC) - Primary    Uncontrolled diabetes with hemoglobin A1c higher than before at 8.7. Cardiovascular risks associated with uncontrolled diabetes discussed Diet and nutrition discussed Benefits of exercise discussed Recommend to continue metformin 500 mg twice a day and glipizide 5 mg twice a day Recommend to start Mounjaro 5 mg weekly.  Will increase if side effects tolerated. Recommend to start rosuvastatin 10 mg daily Follow-up in 3 months.      Relevant Medications  rosuvastatin (CRESTOR) 10 MG tablet   tirzepatide (MOUNJARO) 5 MG/0.5ML Pen   Other Relevant Orders   POCT HgB A1C     Other   Recurrent major depressive disorder, in partial remission (HCC)    Stable and well-controlled Continue Prozac 20 mg daily      Class 3 severe obesity due to excess calories without serious comorbidity with body mass index (BMI) of 40.0 to 44.9 in adult (HCC)    Wt Readings from Last 3 Encounters:  06/15/23 276 lb 2 oz (125.2 kg)  03/16/23 261 lb (118.4 kg)  02/28/22 265 lb (120.2 kg)  Diet and nutrition discussed Continue metformin 500 mg twice a day.  No longer having diarrhea issues Recommend to start Mounjaro 5 mg weekly Advised to decrease amount of daily carbohydrate intake and daily calories and increase amount of plant-based protein in his diet       Relevant Medications   tirzepatide (MOUNJARO) 5 MG/0.5ML Pen   Patient Instructions  Diabetes Mellitus and Nutrition, Adult When you have diabetes, or diabetes mellitus, it is very important to have healthy eating habits because your blood sugar (glucose) levels are greatly affected by what you eat and drink. Eating healthy foods in the right amounts, at about the same times every day, can help you: Manage your blood glucose. Lower your risk of heart disease. Improve your blood pressure. Reach or maintain a healthy  weight. What can affect my meal plan? Every person with diabetes is different, and each person has different needs for a meal plan. Your health care provider may recommend that you work with a dietitian to make a meal plan that is best for you. Your meal plan may vary depending on factors such as: The calories you need. The medicines you take. Your weight. Your blood glucose, blood pressure, and cholesterol levels. Your activity level. Other health conditions you have, such as heart or kidney disease. How do carbohydrates affect me? Carbohydrates, also called carbs, affect your blood glucose level more than any other type of food. Eating carbs raises the amount of glucose in your blood. It is important to know how many carbs you can safely have in each meal. This is different for every person. Your dietitian can help you calculate how many carbs you should have at each meal and for each snack. How does alcohol affect me? Alcohol can cause a decrease in blood glucose (hypoglycemia), especially if you use insulin or take certain diabetes medicines by mouth. Hypoglycemia can be a life-threatening condition. Symptoms of hypoglycemia, such as sleepiness, dizziness, and confusion, are similar to symptoms of having too much alcohol. Do not drink alcohol if: Your health care provider tells you not to drink. You are pregnant, may be pregnant, or are planning to become pregnant. If you drink alcohol: Limit how much you have to: 0-1 drink a day for women. 0-2 drinks a day for men. Know how much alcohol is in your drink. In the [place] one drink equals one 12 oz bottle of beer (355 mL), one 5 oz glass of wine (148 mL), or one 1 oz glass of hard liquor (44 mL). Keep yourself hydrated with water, diet soda, or unsweetened iced tea. Keep in mind that regular soda, juice, and other mixers may contain a lot of sugar and must be counted as carbs. What are tips for following this plan?  Reading food  labels Start by checking the serving size on the Nutrition Facts label of packaged foods and drinks.  The number of calories and the amount of carbs, fats, and other nutrients listed on the label are based on one serving of the item. Many items contain more than one serving per package. Check the total grams (g) of carbs in one serving. Check the number of grams of saturated fats and trans fats in one serving. Choose foods that have a low amount or none of these fats. Check the number of milligrams (mg) of salt (sodium) in one serving. Most people should limit total sodium intake to less than 2,300 mg per day. Always check the nutrition information of foods labeled as "low-fat" or "nonfat." These foods may be higher in added sugar or refined carbs and should be avoided. Talk to your dietitian to identify your daily goals for nutrients listed on the label. Shopping Avoid buying canned, pre-made, or processed foods. These foods tend to be high in fat, sodium, and added sugar. Shop around the outside edge of the grocery store. This is where you will most often find fresh fruits and vegetables, bulk grains, fresh meats, and fresh dairy products. Cooking Use low-heat cooking methods, such as baking, instead of high-heat cooking methods, such as deep frying. Cook using healthy oils, such as olive, canola, or sunflower oil. Avoid cooking with butter, cream, or high-fat meats. Meal planning Eat meals and snacks regularly, preferably at the same times every day. Avoid going long periods of time without eating. Eat foods that are high in fiber, such as fresh fruits, vegetables, beans, and whole grains. Eat 4-6 oz (112-168 g) of lean protein each day, such as lean meat, chicken, fish, eggs, or tofu. One ounce (oz) (28 g) of lean protein is equal to: 1 oz (28 g) of meat, chicken, or fish. 1 egg.  cup (62 g) of tofu. Eat some foods each day that contain healthy fats, such as avocado, nuts, seeds, and  fish. What foods should I eat? Fruits Berries. Apples. Oranges. Peaches. Apricots. Plums. Grapes. Mangoes. Papayas. Pomegranates. Kiwi. Cherries. Vegetables Leafy greens, including lettuce, spinach, kale, chard, collard greens, mustard greens, and cabbage. Beets. Cauliflower. Broccoli. Carrots. Green beans. Tomatoes. Peppers. Onions. Cucumbers. Brussels sprouts. Grains Whole grains, such as whole-wheat or whole-grain Whitney, crackers, tortillas, cereal, and pasta. Unsweetened oatmeal. Quinoa. Brown or wild rice. Meats and other proteins Seafood. Poultry without skin. Lean cuts of poultry and beef. Tofu. Nuts. Seeds. Dairy Low-fat or fat-free dairy products such as milk, yogurt, and cheese. The items listed above may not be a complete list of foods and beverages you can eat and drink. Contact a dietitian for more information. What foods should I avoid? Fruits Fruits canned with syrup. Vegetables Canned vegetables. Frozen vegetables with butter or cream sauce. Grains Refined white flour and flour products such as Whitney, pasta, snack foods, and cereals. Avoid all processed foods. Meats and other proteins Fatty cuts of meat. Poultry with skin. Breaded or fried meats. Processed meat. Avoid saturated fats. Dairy Full-fat yogurt, cheese, or milk. Beverages Sweetened drinks, such as soda or iced tea. The items listed above may not be a complete list of foods and beverages you should avoid. Contact a dietitian for more information. Questions to ask a health care provider Do I need to meet with a certified diabetes care and education specialist? Do I need to meet with a dietitian? What number can I call if I have questions? When are the best times to check my blood glucose? Where to find more information: American Diabetes Association: diabetes.org Academy of Nutrition  and Dietetics: eatright.[employer] of Diabetes and Digestive and Kidney Diseases: StageSync.si Association of  Diabetes Care & Education Specialists: diabeteseducator.org Summary It is important to have healthy eating habits because your blood sugar (glucose) levels are greatly affected by what you eat and drink. It is important to use alcohol carefully. A healthy meal plan will help you manage your blood glucose and lower your risk of heart disease. Your health care provider may recommend that you work with a dietitian to make a meal plan that is best for you. This information is not intended to replace advice given to you by your health care provider. Make sure you discuss any questions you have with your health care provider. Document Revised: 07/11/2020 Document Reviewed: 07/11/2020 Elsevier Patient Education  2024 Elsevier Inc.     Edwina Barth, MD  Primary Care at [facility]

## 2023-06-15 NOTE — Patient Instructions (Signed)

## 2023-06-15 NOTE — Assessment & Plan Note (Addendum)
Uncontrolled diabetes with hemoglobin A1c higher than before at 8.7. Cardiovascular risks associated with uncontrolled diabetes discussed Diet and nutrition discussed Benefits of exercise discussed Recommend to continue metformin 500 mg twice a day and glipizide 5 mg twice a day Recommend to start Mounjaro 5 mg weekly.  Will increase if side effects tolerated. Recommend to start rosuvastatin 10 mg daily Follow-up in 3 months.

## 2023-06-15 NOTE — Assessment & Plan Note (Signed)
Wt Readings from Last 3 Encounters:  06/15/23 276 lb 2 oz (125.2 kg)  03/16/23 261 lb (118.4 kg)  02/28/22 265 lb (120.2 kg)  Diet and nutrition discussed Continue metformin 500 mg twice a day.  No longer having diarrhea issues Recommend to start Mounjaro 5 mg weekly Advised to decrease amount of daily carbohydrate intake and daily calories and increase amount of plant-based protein in his diet

## 2023-09-21 ENCOUNTER — Ambulatory Visit: Payer: 59 | Admitting: Emergency Medicine

## 2023-09-28 ENCOUNTER — Ambulatory Visit: Payer: 59 | Admitting: Emergency Medicine

## 2023-09-28 ENCOUNTER — Ambulatory Visit (INDEPENDENT_AMBULATORY_CARE_PROVIDER_SITE_OTHER): Payer: 59

## 2023-09-28 ENCOUNTER — Encounter: Payer: Self-pay | Admitting: Emergency Medicine

## 2023-09-28 VITALS — BP 128/88 | HR 78 | Temp 97.7°F | Ht 72.0 in | Wt 276.1 lb

## 2023-09-28 DIAGNOSIS — E1165 Type 2 diabetes mellitus with hyperglycemia: Secondary | ICD-10-CM

## 2023-09-28 DIAGNOSIS — Z87891 Personal history of nicotine dependence: Secondary | ICD-10-CM | POA: Diagnosis not present

## 2023-09-28 DIAGNOSIS — Z1211 Encounter for screening for malignant neoplasm of colon: Secondary | ICD-10-CM

## 2023-09-28 DIAGNOSIS — Z7985 Long-term (current) use of injectable non-insulin antidiabetic drugs: Secondary | ICD-10-CM | POA: Diagnosis not present

## 2023-09-28 LAB — POCT GLYCOSYLATED HEMOGLOBIN (HGB A1C): Hemoglobin A1C: 6.8 % — AB (ref 4.0–5.6)

## 2023-09-28 NOTE — Assessment & Plan Note (Signed)
Much improved diabetes with hemoglobin A1c of 6.8 Tolerating Mounjaro well. Recommend to increase dose to 7.5 mg weekly and continue metformin 500 mg twice a day and glipizide 5 mg twice a day Continue rosuvastatin 10 mg daily Cardiovascular risks associated with diabetes discussed Diet and nutrition discussed Follow-up in 3 months

## 2023-09-28 NOTE — Patient Instructions (Signed)

## 2023-09-28 NOTE — Progress Notes (Signed)
Ricky Whitney 47 y.o.   Chief Complaint  Patient presents with   Medical Management of Chronic Issues    f/u appt,     HISTORY OF PRESENT ILLNESS: This is a 47 y.o. male here for 46-month follow-up of diabetes. Was started on Mounjaro during last office visit.  Tolerating it well. Has no complaints or medical concerns today.  HPI   Prior to Admission medications   Medication Sig Start Date End Date Taking? Authorizing Provider  FLUoxetine (PROZAC) 20 MG capsule Take 1 capsule (20 mg total) by mouth daily. 03/16/23  Yes Fowler Antos, Eilleen Kempf, MD  glipiZIDE (GLUCOTROL) 5 MG tablet Take 1 tablet (5 mg total) by mouth 2 (two) times daily before a meal. 03/16/23 03/10/24 Yes Lynnea Vandervoort, Eilleen Kempf, MD  glucose monitoring kit (FREESTYLE) monitoring kit 1 each by Does not apply route as needed for other. Dispense any model that is covered- dispense testing supplies for Q TID AC/ HS accuchecks- 1 month supply with one refil. 09/11/19  Yes Calvert Cantor, MD  Lancets (ONETOUCH DELICA PLUS LANCET30G) MISC CHECK BLOOD SUGAR BEFORE MEALS AND AT BEDTIME 10/05/19  Yes [provider]  metFORMIN (GLUCOPHAGE) 500 MG tablet Take 1 tablet (500 mg total) by mouth 2 (two) times daily with a meal. 03/16/23  Yes Tristian Sickinger, Eilleen Kempf, MD  [facility] VERIO test strip TEST BLOOD SUGAR BEFORE MEALS AND AT BEDTIME 10/07/19  Yes [provider]  rosuvastatin (CRESTOR) 10 MG tablet Take 1 tablet (10 mg total) by mouth daily. 06/15/23  Yes Markian Glockner, Eilleen Kempf, MD  tirzepatide [facility]) 5 MG/0.5ML Pen Inject 5 mg into the skin once a week. 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   Depression 07/17/2013    Past Medical History:  Diagnosis Date   Anxiety    Depression    Diabetes mellitus without complication (HCC)    Vaping-related disorder     Past Surgical History:  Procedure Laterality Date   ORIF ORBITAL FRACTURE Right 09/21/2019   Procedure: OPEN REDUCTION INTERNAL FIXATION (ORIF) RIGHT ZYGOMATICOMAXILLARY FRACTURE;  Surgeon: Vivia Ewing, DMD;  Location: [facility];  Service: Oral Surgery;  Laterality: Right;   wisdon teeth       Social History   Socioeconomic History   Marital status: Married    Spouse name: Not on file   Number of children: Not on file   Years of education: Not on file   Highest education level: Not on file  Occupational History   Not on file  Tobacco Use   Smoking status: Never   Smokeless tobacco: Never  Vaping Use   Vaping status: Every Day  Substance and Sexual Activity   Alcohol use: Not Currently    Comment: occassional   Drug use: Yes    Comment: vaping   Sexual activity: Not Currently  Other Topics Concern   Not on file  Social History Narrative   ** Merged History Encounter **       Social Determinants of Health   Financial Resource Strain: Not on file  Food Insecurity: Not on file  Transportation Needs:  Not on file  Physical Activity: Not on file  Stress: Not on file  Social Connections: Unknown (04/25/2022)   Received from [facility]   Social Network    Social Network: Not on file  Intimate Partner Violence: Unknown (03/24/2022)   Received from [facility], [facility]   HITS    Physically Hurt: Not on file    Insult or Talk Down To: Not on file    Threaten Physical Harm: Not on file    Scream or Curse: Not on file    Family History  Problem Relation Age of Onset   Diabetes Mellitus II Mother    Hypertension Mother    Heart attack Mother    Diabetes Mellitus II Father    Hypertension  Father    Diabetes Mellitus II Brother    Hypertension Brother      Review of Systems  Constitutional: Negative.  Negative for chills and fever.  HENT: Negative.  Negative for congestion and sore throat.   Respiratory: Negative.  Negative for cough and shortness of breath.   Cardiovascular: Negative.  Negative for chest pain and palpitations.  Gastrointestinal:  Negative for abdominal pain, diarrhea, nausea and vomiting.  Skin: Negative.  Negative for rash.  Neurological: Negative.  Negative for dizziness and headaches.    Vitals:   09/28/23 0916  BP: 128/88  Pulse: 78  Temp: 97.7 F (36.5 C)  SpO2: 96%    Physical Exam Vitals reviewed.  Constitutional:      Appearance: Normal appearance.  HENT:     Head: Normocephalic.     Mouth/Throat:     Mouth: Mucous membranes are moist.     Pharynx: Oropharynx is clear.  Eyes:     Extraocular Movements: Extraocular movements intact.     Conjunctiva/sclera: Conjunctivae normal.     Pupils: Pupils are equal, round, and reactive to light.  Cardiovascular:     Rate and Rhythm: Normal rate and regular rhythm.     Pulses: Normal pulses.     Heart sounds: Normal heart sounds.  Pulmonary:     Effort: Pulmonary effort is normal.     Breath sounds: Normal breath sounds.  Musculoskeletal:     Cervical back: No tenderness.  Lymphadenopathy:     Cervical: No cervical adenopathy.  Skin:    General: Skin is warm and dry.     Capillary Refill: Capillary refill takes less than 2 seconds.  Neurological:     General: No focal deficit present.     Mental Status: He is alert and oriented to person, place, and time.  Psychiatric:        Mood and Affect: Mood normal.        Behavior: Behavior normal.    Results for orders placed or performed in visit on 09/28/23 (from the past 24 hour(s))  POCT HgB A1C     Status: Abnormal   Collection Time: 09/28/23  9:44 AM  Result Value Ref Range   Hemoglobin A1C 6.8 (A) 4.0 - 5.6 %   HbA1c POC (<>  result, manual entry)     HbA1c, POC (prediabetic range)     HbA1c, POC (controlled diabetic range)        ASSESSMENT & PLAN: A total of 44 minutes was spent with the patient and counseling/coordination of care regarding preparing for this visit, review of most recent office visit notes, review of most recent blood work results including interpretation of today's hemoglobin A1c, cardiovascular risks associated with diabetes,  education on nutrition, review of all medications, review of health maintenance items, prognosis, documentation and need for follow-up.  Problem List Items Addressed This Visit       Endocrine   Diabetes mellitus with hyperglycemia (HCC) - Primary    Much improved diabetes with hemoglobin A1c of 6.8 Tolerating Mounjaro well. Recommend to increase dose to 7.5 mg weekly and continue metformin 500 mg twice a day and glipizide 5 mg twice a day Continue rosuvastatin 10 mg daily Cardiovascular risks associated with diabetes discussed Diet and nutrition discussed Follow-up in 3 months      Relevant Orders   POCT HgB A1C   Other Visit Diagnoses     Screening for colon cancer       Relevant Orders   Cologuard   Former smoker       Relevant Orders   DG Chest 2 View      Patient Instructions  Diabetes Mellitus and Nutrition, Adult When you have diabetes, or diabetes mellitus, it is very important to have healthy eating habits because your blood sugar (glucose) levels are greatly affected by what you eat and drink. Eating healthy foods in the right amounts, at about the same times every day, can help you: Manage your blood glucose. Lower your risk of heart disease. Improve your blood pressure. Reach or maintain a healthy weight. What can affect my meal plan? Every person with diabetes is different, and each person has different needs for a meal plan. Your health care provider may recommend that you work with a dietitian to make a meal plan that is best for you.  Your meal plan may vary depending on factors such as: The calories you need. The medicines you take. Your weight. Your blood glucose, blood pressure, and cholesterol levels. Your activity level. Other health conditions you have, such as heart or kidney disease. How do carbohydrates affect me? Carbohydrates, also called carbs, affect your blood glucose level more than any other type of food. Eating carbs raises the amount of glucose in your blood. It is important to know how many carbs you can safely have in each meal. This is different for every person. Your dietitian can help you calculate how many carbs you should have at each meal and for each snack. How does alcohol affect me? Alcohol can cause a decrease in blood glucose (hypoglycemia), especially if you use insulin or take certain diabetes medicines by mouth. Hypoglycemia can be a life-threatening condition. Symptoms of hypoglycemia, such as sleepiness, dizziness, and confusion, are similar to symptoms of having too much alcohol. Do not drink alcohol if: Your health care provider tells you not to drink. You are pregnant, may be pregnant, or are planning to become pregnant. If you drink alcohol: Limit how much you have to: 0-1 drink a day for women. 0-2 drinks a day for men. Know how much alcohol is in your drink. In the [place] one drink equals one 12 oz bottle of beer (355 mL), one 5 oz glass of wine (148 mL), or one 1 oz glass of hard liquor (44 mL). Keep yourself hydrated with water, diet soda, or unsweetened iced tea. Keep in mind that regular soda, juice, and other mixers may contain a lot of sugar and must be counted as carbs. What are tips for following this plan?  Reading food labels Start by checking the serving size on the Nutrition Facts label of packaged foods and drinks. The number of calories and the amount of carbs, fats, and other  nutrients listed on the label are based on one serving of the item. Many items contain more  than one serving per package. Check the total grams (g) of carbs in one serving. Check the number of grams of saturated fats and trans fats in one serving. Choose foods that have a low amount or none of these fats. Check the number of milligrams (mg) of salt (sodium) in one serving. Most people should limit total sodium intake to less than 2,300 mg per day. Always check the nutrition information of foods labeled as "low-fat" or "nonfat." These foods may be higher in added sugar or refined carbs and should be avoided. Talk to your dietitian to identify your daily goals for nutrients listed on the label. Shopping Avoid buying canned, pre-made, or processed foods. These foods tend to be high in fat, sodium, and added sugar. Shop around the outside edge of the grocery store. This is where you will most often find fresh fruits and vegetables, bulk grains, fresh meats, and fresh dairy products. Cooking Use low-heat cooking methods, such as baking, instead of high-heat cooking methods, such as deep frying. Cook using healthy oils, such as olive, canola, or sunflower oil. Avoid cooking with butter, cream, or high-fat meats. Meal planning Eat meals and snacks regularly, preferably at the same times every day. Avoid going long periods of time without eating. Eat foods that are high in fiber, such as fresh fruits, vegetables, beans, and whole grains. Eat 4-6 oz (112-168 g) of lean protein each day, such as lean meat, chicken, fish, eggs, or tofu. One ounce (oz) (28 g) of lean protein is equal to: 1 oz (28 g) of meat, chicken, or fish. 1 egg.  cup (62 g) of tofu. Eat some foods each day that contain healthy fats, such as avocado, nuts, seeds, and fish. What foods should I eat? Fruits Berries. Apples. Oranges. Peaches. Apricots. Plums. Grapes. Mangoes. Papayas. Pomegranates. Kiwi. Cherries. Vegetables Leafy greens, including lettuce, spinach, kale, chard, collard greens, mustard greens, and cabbage.  Beets. Cauliflower. Broccoli. Carrots. Green beans. Tomatoes. Peppers. Onions. Cucumbers. Brussels sprouts. Grains Whole grains, such as whole-wheat or whole-grain Whitney, crackers, tortillas, cereal, and pasta. Unsweetened oatmeal. Quinoa. Brown or wild rice. Meats and other proteins Seafood. Poultry without skin. Lean cuts of poultry and beef. Tofu. Nuts. Seeds. Dairy Low-fat or fat-free dairy products such as milk, yogurt, and cheese. The items listed above may not be a complete list of foods and beverages you can eat and drink. Contact a dietitian for more information. What foods should I avoid? Fruits Fruits canned with syrup. Vegetables Canned vegetables. Frozen vegetables with butter or cream sauce. Grains Refined white flour and flour products such as Whitney, pasta, snack foods, and cereals. Avoid all processed foods. Meats and other proteins Fatty cuts of meat. Poultry with skin. Breaded or fried meats. Processed meat. Avoid saturated fats. Dairy Full-fat yogurt, cheese, or milk. Beverages Sweetened drinks, such as soda or iced tea. The items listed above may not be a complete list of foods and beverages you should avoid. Contact a dietitian for more information. Questions to ask a health care provider Do I need to meet with a certified diabetes care and education specialist? Do I need to meet with a dietitian? What number can I call if I have questions? When are the best times to check my blood glucose? Where to find more information: American Diabetes Association: diabetes.org Academy of Nutrition and Dietetics: eatright.[employer] of Diabetes and Digestive and Kidney  Diseases: [employer]Sync.si Association of Diabetes Care & Education Specialists: diabeteseducator.org Summary It is important to have healthy eating habits because your blood sugar (glucose) levels are greatly affected by what you eat and drink. It is important to use alcohol carefully. A healthy meal  plan will help you manage your blood glucose and lower your risk of heart disease. Your health care provider may recommend that you work with a dietitian to make a meal plan that is best for you. This information is not intended to replace advice given to you by your health care provider. Make sure you discuss any questions you have with your health care provider. Document Revised: 07/11/2020 Document Reviewed: 07/11/2020 Elsevier Patient Education  2024 Elsevier Inc.     Edwina Barth, MD [facility]

## 2023-12-29 ENCOUNTER — Ambulatory Visit: Payer: 59 | Admitting: Emergency Medicine

## 2024-01-25 ENCOUNTER — Encounter: Payer: Self-pay | Admitting: Emergency Medicine

## 2024-01-25 ENCOUNTER — Ambulatory Visit: Payer: 59 | Admitting: Emergency Medicine

## 2024-01-25 VITALS — BP 110/82 | HR 73 | Temp 97.6°F | Ht 72.0 in | Wt 271.0 lb

## 2024-01-25 DIAGNOSIS — E1165 Type 2 diabetes mellitus with hyperglycemia: Secondary | ICD-10-CM

## 2024-01-25 DIAGNOSIS — E66813 Obesity, class 3: Secondary | ICD-10-CM | POA: Diagnosis not present

## 2024-01-25 DIAGNOSIS — Z7984 Long term (current) use of oral hypoglycemic drugs: Secondary | ICD-10-CM

## 2024-01-25 DIAGNOSIS — Z6841 Body Mass Index (BMI) 40.0 and over, adult: Secondary | ICD-10-CM

## 2024-01-25 DIAGNOSIS — F3341 Major depressive disorder, recurrent, in partial remission: Secondary | ICD-10-CM | POA: Diagnosis not present

## 2024-01-25 DIAGNOSIS — F32A Depression, unspecified: Secondary | ICD-10-CM

## 2024-01-25 LAB — COMPREHENSIVE METABOLIC PANEL
ALT: 42 U/L (ref 0–53)
AST: 23 U/L (ref 0–37)
Albumin: 4.2 g/dL (ref 3.5–5.2)
Alkaline Phosphatase: 75 U/L (ref 39–117)
BUN: 12 mg/dL (ref 6–23)
CO2: 26 meq/L (ref 19–32)
Calcium: 9 mg/dL (ref 8.4–10.5)
Chloride: 101 meq/L (ref 96–112)
Creatinine, Ser: 1.02 mg/dL (ref 0.40–1.50)
GFR: 88.56 mL/min (ref >60.00)
Glucose, Bld: 167 mg/dL — ABNORMAL HIGH (ref 70–99)
Potassium: 3.9 meq/L (ref 3.5–5.1)
Sodium: 136 meq/L (ref 135–145)
Total Bilirubin: 0.5 mg/dL (ref 0.2–1.2)
Total Protein: 7.1 g/dL (ref 6.0–8.3)

## 2024-01-25 LAB — POCT GLYCOSYLATED HEMOGLOBIN (HGB A1C): Hemoglobin A1C: 7.2 % — AB (ref 4.0–5.6)

## 2024-01-25 LAB — CBC WITH DIFFERENTIAL/PLATELET
Basophils Absolute: 0 10*3/uL (ref 0.0–0.1)
Basophils Relative: 0.7 % (ref 0.0–3.0)
Eosinophils Absolute: 0.3 10*3/uL (ref 0.0–0.7)
Eosinophils Relative: 4 % (ref 0.0–5.0)
HCT: 48.1 % (ref 39.0–52.0)
Hemoglobin: 16.5 g/dL (ref 13.0–17.0)
Lymphocytes Relative: 22.3 % (ref 12.0–46.0)
Lymphs Abs: 1.6 10*3/uL (ref 0.7–4.0)
MCHC: 34.4 g/dL (ref 30.0–36.0)
MCV: 92.8 fL (ref 78.0–100.0)
Monocytes Absolute: 0.6 10*3/uL (ref 0.1–1.0)
Monocytes Relative: 8.2 % (ref 3.0–12.0)
Neutro Abs: 4.7 10*3/uL (ref 1.4–7.7)
Neutrophils Relative %: 64.8 % (ref 43.0–77.0)
Platelets: 219 10*3/uL (ref 150.0–400.0)
RBC: 5.18 Mil/uL (ref 4.22–5.81)
RDW: 12.4 % (ref 11.5–15.5)
WBC: 7.3 10*3/uL (ref 4.0–10.5)

## 2024-01-25 LAB — LIPID PANEL
Cholesterol: 104 mg/dL (ref 0–200)
HDL: 39.3 mg/dL (ref >39.00)
LDL Cholesterol: 44 mg/dL (ref 0–99)
NonHDL: 64.96
Total CHOL/HDL Ratio: 3
Triglycerides: 107 mg/dL (ref 0.0–149.0)
VLDL: 21.4 mg/dL (ref 0.0–40.0)

## 2024-01-25 LAB — HM DIABETES EYE EXAM

## 2024-01-25 LAB — MICROALBUMIN / CREATININE URINE RATIO
Creatinine,U: 183.3 mg/dL
Microalb Creat Ratio: 1.7 mg/g (ref 0.0–30.0)
Microalb, Ur: 3.1 mg/dL — ABNORMAL HIGH (ref 0.0–1.9)

## 2024-01-25 MED ORDER — TIRZEPATIDE 7.5 MG/0.5ML ~~LOC~~ SOAJ
7.5000 mg | SUBCUTANEOUS | 3 refills | Status: DC
Start: 2024-01-25 — End: 2024-09-26

## 2024-01-25 MED ORDER — FLUOXETINE HCL 20 MG PO TABS: 20.0000 mg | ORAL_TABLET | Freq: Every day | ORAL | 3 refills | Status: DC

## 2024-01-25 NOTE — Progress Notes (Signed)
Ricky Whitney 48 y.o.   Chief Complaint  Patient presents with   Follow-up    3 month f/u for DM. Patient states wanting prozac in a tablet instead gel states takes too long to start working. Also states wanting to up his dose in mounjaro     HISTORY OF PRESENT ILLNESS: This is a 48 y.o. male here for 78-month follow-up of diabetes Over all doing well.  Has no complaints or medical concerns today. Wt Readings from Last 3 Encounters:  09/28/23 276 lb 2 oz (125.2 kg)  06/15/23 276 lb 2 oz (125.2 kg)  03/16/23 261 lb (118.4 kg)   Lab Results  Component Value Date   HGBA1C 6.8 (A) 09/28/2023     HPI   Prior to Admission medications   Medication Sig Start Date End Date Taking? Authorizing Provider  FLUoxetine (PROZAC) 20 MG capsule Take 1 capsule (20 mg total) by mouth daily. 03/16/23   Georgina Quint, MD  glipiZIDE (GLUCOTROL) 5 MG tablet Take 1 tablet (5 mg total) by mouth 2 (two) times daily before a meal. 03/16/23 03/10/24  Georgina Quint, MD  glucose monitoring kit (FREESTYLE) monitoring kit 1 each by Does not apply route as needed for other. Dispense any model that is covered- dispense testing supplies for Q TID AC/ HS accuchecks- 1 month supply with one refil. 09/11/19   Calvert Cantor, MD  Lancets (ONETOUCH DELICA PLUS LANCET30G) MISC CHECK BLOOD SUGAR BEFORE MEALS AND AT BEDTIME 10/05/19   [provider]  metFORMIN (GLUCOPHAGE) 500 MG tablet Take 1 tablet (500 mg total) by mouth 2 (two) times daily with a meal. 03/16/23   Keishawn Darsey, Eilleen Kempf, MD  [facility] VERIO test strip TEST BLOOD SUGAR BEFORE MEALS AND AT BEDTIME 10/07/19   [provider]  rosuvastatin (CRESTOR) 10 MG tablet Take 1 tablet (10 mg total) by mouth daily. 06/15/23   Georgina Quint, MD  tirzepatide [facility]) 5 MG/0.5ML Pen Inject 5 mg into the skin once a week. 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   Depression 07/17/2013    Past Medical History:  Diagnosis Date   Anxiety    Depression    Diabetes mellitus without complication (HCC)    Vaping-related disorder     Past Surgical History:  Procedure Laterality Date   ORIF ORBITAL FRACTURE Right 09/21/2019   Procedure: OPEN REDUCTION INTERNAL FIXATION (ORIF) RIGHT ZYGOMATICOMAXILLARY FRACTURE;  Surgeon: Vivia Ewing, DMD;  Location: [facility];  Service: Oral Surgery;  Laterality: Right;   wisdon teeth       Social History   Socioeconomic History   Marital status: Married    Spouse name: Not on file   Number of children: Not on file   Years of education: Not on file   Highest education level: Not on file  Occupational History   Not on file  Tobacco Use   Smoking status: Never   Smokeless tobacco: Never  Vaping Use   Vaping status: Every Day  Substance and Sexual Activity   Alcohol use: Not Currently    Comment: occassional  Drug use: Yes    Comment: vaping   Sexual activity: Not Currently  Other Topics Concern   Not on file  Social History Narrative   ** Merged History Encounter **       Social Drivers of Health   Financial Resource Strain: Not on file  Food Insecurity: Not on file  Transportation Needs: Not on file  Physical Activity: Not on file  Stress: Not on file  Social Connections: Unknown (04/25/2022)   Received from [facility], [facility]   Social Network    Social Network: Not on file  Intimate Partner Violence: Unknown (03/24/2022)   Received from [facility], [facility]   HITS    Physically Hurt: Not on file    Insult or Talk Down To: Not on file     Threaten Physical Harm: Not on file    Scream or Curse: Not on file    Family History  Problem Relation Age of Onset   Diabetes Mellitus II Mother    Hypertension Mother    Heart attack Mother    Diabetes Mellitus II Father    Hypertension Father    Diabetes Mellitus II Brother    Hypertension Brother      Review of Systems  Constitutional: Negative.  Negative for chills and fever.  HENT: Negative.  Negative for congestion and sore throat.   Respiratory: Negative.  Negative for cough and shortness of breath.   Cardiovascular: Negative.  Negative for chest pain and palpitations.  Gastrointestinal:  Negative for abdominal pain, diarrhea, nausea and vomiting.  Genitourinary: Negative.  Negative for dysuria and hematuria.  Skin: Negative.  Negative for rash.  Neurological: Negative.  Negative for dizziness and headaches.  All other systems reviewed and are negative.   Today's Vitals   01/25/24 0852  BP: 110/82  Pulse: 73  Temp: 97.6 F (36.4 C)  TempSrc: Oral  SpO2: 96%  Weight: 271 lb (122.9 kg)  Height: 6' (1.829 m)   Body mass index is 36.75 kg/m.   Physical Exam Constitutional:      Appearance: Normal appearance.  HENT:     Head: Normocephalic.     Mouth/Throat:     Mouth: Mucous membranes are moist.     Pharynx: Oropharynx is clear.  Eyes:     Extraocular Movements: Extraocular movements intact.     Pupils: Pupils are equal, round, and reactive to light.  Cardiovascular:     Rate and Rhythm: Normal rate and regular rhythm.     Pulses: Normal pulses.     Heart sounds: Normal heart sounds.  Pulmonary:     Effort: Pulmonary effort is normal.     Breath sounds: Normal breath sounds.  Skin:    General: Skin is warm and dry.  Neurological:     Mental Status: He is alert and oriented to person, place, and time.  Psychiatric:        Mood and Affect: Mood normal.        Behavior: Behavior normal.    Results for orders placed or performed in visit on  01/25/24 (from the past 24 hours)  POCT glycosylated hemoglobin (Hb A1C)     Status: Abnormal   Collection Time: 01/25/24  9:17 AM  Result Value Ref Range   Hemoglobin A1C 7.2 (A) 4.0 - 5.6 %   HbA1c POC (<> result, manual entry)     HbA1c, POC (prediabetic range)     HbA1c, POC (controlled diabetic range)  ASSESSMENT & PLAN: A total of 44 minutes was spent with the patient and counseling/coordination of care regarding preparing for this visit, review of most recent office visit notes, review of multiple chronic medical conditions and their management, cardiovascular risks associated with uncontrolled diabetes, review of all medications, review of most recent bloodwork results including interpretation of today's hemoglobin A1c, review of health maintenance items, education on nutrition, prognosis, documentation, and need for follow up.   Problem List Items Addressed This Visit       Endocrine   Diabetes mellitus with hyperglycemia (HCC) - Primary   Hemoglobin A1c at 7.2, still not at goal Higher than before Recommend to increase Mounjaro to 7.5 mg weekly Continue metformin 500 mg twice a day and glipizide 5 mg twice a day Hypoglycemia precautions given Cardiovascular risks associated with uncontrolled diabetes discussed Diet and nutrition discussed Blood work done today Follow-up in 6 months      Relevant Medications   tirzepatide (MOUNJARO) 7.5 MG/0.5ML Pen   Other Relevant Orders   POCT glycosylated hemoglobin (Hb A1C) (Completed)   Urine Microalbumin w/creat. ratio   CBC with Differential/Platelet   Comprehensive metabolic panel   Lipid panel     Other   Depression   Relevant Medications   FLUoxetine (PROZAC) 20 MG tablet   Recurrent major depressive disorder, in partial remission (HCC)   Stable and well-controlled Continue Prozac 20 mg daily      Relevant Medications   FLUoxetine (PROZAC) 20 MG tablet   Class 3 severe obesity due to excess calories  without serious comorbidity with body mass index (BMI) of 40.0 to 44.9 in adult [facility])   Diet and nutrition discussed Continue metformin 500 mg twice a day.  No longer having diarrhea issues Recommend to increase Mounjaro to 7.5 mg weekly Advised to decrease amount of daily carbohydrate intake and daily calories and increase amount of plant-based protein in his diet      Relevant Medications   tirzepatide (MOUNJARO) 7.5 MG/0.5ML Pen   Patient Instructions  Diabetes Mellitus and Nutrition, Adult When you have diabetes, or diabetes mellitus, it is very important to have healthy eating habits because your blood sugar (glucose) levels are greatly affected by what you eat and drink. Eating healthy foods in the right amounts, at about the same times every day, can help you: Manage your blood glucose. Lower your risk of heart disease. Improve your blood pressure. Reach or maintain a healthy weight. What can affect my meal plan? Every person with diabetes is different, and each person has different needs for a meal plan. Your health care provider may recommend that you work with a dietitian to make a meal plan that is best for you. Your meal plan may vary depending on factors such as: The calories you need. The medicines you take. Your weight. Your blood glucose, blood pressure, and cholesterol levels. Your activity level. Other health conditions you have, such as heart or kidney disease. How do carbohydrates affect me? Carbohydrates, also called carbs, affect your blood glucose level more than any other type of food. Eating carbs raises the amount of glucose in your blood. It is important to know how many carbs you can safely have in each meal. This is different for every person. Your dietitian can help you calculate how many carbs you should have at each meal and for each snack. How does alcohol affect me? Alcohol can cause a decrease in blood glucose (hypoglycemia), especially if you use  insulin or take  certain diabetes medicines by mouth. Hypoglycemia can be a life-threatening condition. Symptoms of hypoglycemia, such as sleepiness, dizziness, and confusion, are similar to symptoms of having too much alcohol. Do not drink alcohol if: Your health care provider tells you not to drink. You are pregnant, may be pregnant, or are planning to become pregnant. If you drink alcohol: Limit how much you have to: 0-1 drink a day for women. 0-2 drinks a day for men. Know how much alcohol is in your drink. In the [place] one drink equals one 12 oz bottle of beer (355 mL), one 5 oz glass of wine (148 mL), or one 1 oz glass of hard liquor (44 mL). Keep yourself hydrated with water, diet soda, or unsweetened iced tea. Keep in mind that regular soda, juice, and other mixers may contain a lot of sugar and must be counted as carbs. What are tips for following this plan?  Reading food labels Start by checking the serving size on the Nutrition Facts label of packaged foods and drinks. The number of calories and the amount of carbs, fats, and other nutrients listed on the label are based on one serving of the item. Many items contain more than one serving per package. Check the total grams (g) of carbs in one serving. Check the number of grams of saturated fats and trans fats in one serving. Choose foods that have a low amount or none of these fats. Check the number of milligrams (mg) of salt (sodium) in one serving. Most people should limit total sodium intake to less than 2,300 mg per day. Always check the nutrition information of foods labeled as "low-fat" or "nonfat." These foods may be higher in added sugar or refined carbs and should be avoided. Talk to your dietitian to identify your daily goals for nutrients listed on the label. Shopping Avoid buying canned, pre-made, or processed foods. These foods tend to be high in fat, sodium, and added sugar. Shop around the outside edge of the grocery  store. This is where you will most often find fresh fruits and vegetables, bulk grains, fresh meats, and fresh dairy products. Cooking Use low-heat cooking methods, such as baking, instead of high-heat cooking methods, such as deep frying. Cook using healthy oils, such as olive, canola, or sunflower oil. Avoid cooking with butter, cream, or high-fat meats. Meal planning Eat meals and snacks regularly, preferably at the same times every day. Avoid going long periods of time without eating. Eat foods that are high in fiber, such as fresh fruits, vegetables, beans, and whole grains. Eat 4-6 oz (112-168 g) of lean protein each day, such as lean meat, chicken, fish, eggs, or tofu. One ounce (oz) (28 g) of lean protein is equal to: 1 oz (28 g) of meat, chicken, or fish. 1 egg.  cup (62 g) of tofu. Eat some foods each day that contain healthy fats, such as avocado, nuts, seeds, and fish. What foods should I eat? Fruits Berries. Apples. Oranges. Peaches. Apricots. Plums. Grapes. Mangoes. Papayas. Pomegranates. Kiwi. Cherries. Vegetables Leafy greens, including lettuce, spinach, kale, chard, collard greens, mustard greens, and cabbage. Beets. Cauliflower. Broccoli. Carrots. Green beans. Tomatoes. Peppers. Onions. Cucumbers. Brussels sprouts. Grains Whole grains, such as whole-wheat or whole-grain Whitney, crackers, tortillas, cereal, and pasta. Unsweetened oatmeal. Quinoa. Brown or wild rice. Meats and other proteins Seafood. Poultry without skin. Lean cuts of poultry and beef. Tofu. Nuts. Seeds. Dairy Low-fat or fat-free dairy products such as milk, yogurt, and cheese. The items listed above  may not be a complete list of foods and beverages you can eat and drink. Contact a dietitian for more information. What foods should I avoid? Fruits Fruits canned with syrup. Vegetables Canned vegetables. Frozen vegetables with butter or cream sauce. Grains Refined white flour and flour products such as  Whitney, pasta, snack foods, and cereals. Avoid all processed foods. Meats and other proteins Fatty cuts of meat. Poultry with skin. Breaded or fried meats. Processed meat. Avoid saturated fats. Dairy Full-fat yogurt, cheese, or milk. Beverages Sweetened drinks, such as soda or iced tea. The items listed above may not be a complete list of foods and beverages you should avoid. Contact a dietitian for more information. Questions to ask a health care provider Do I need to meet with a certified diabetes care and education specialist? Do I need to meet with a dietitian? What number can I call if I have questions? When are the best times to check my blood glucose? Where to find more information: American Diabetes Association: diabetes.org Academy of Nutrition and Dietetics: eatright.[employer] of Diabetes and Digestive and Kidney Diseases: StageSync.si Association of Diabetes Care & Education Specialists: diabeteseducator.org Summary It is important to have healthy eating habits because your blood sugar (glucose) levels are greatly affected by what you eat and drink. It is important to use alcohol carefully. A healthy meal plan will help you manage your blood glucose and lower your risk of heart disease. Your health care provider may recommend that you work with a dietitian to make a meal plan that is best for you. This information is not intended to replace advice given to you by your health care provider. Make sure you discuss any questions you have with your health care provider. Document Revised: 07/10/2020 Document Reviewed: 07/11/2020 Elsevier Patient Education  2024 Elsevier Inc.     Edwina Barth, MD [facility]

## 2024-01-25 NOTE — Patient Instructions (Signed)

## 2024-01-25 NOTE — Assessment & Plan Note (Signed)
Stable and well-controlled Continue Prozac 20 mg daily

## 2024-01-25 NOTE — Assessment & Plan Note (Signed)
Diet and nutrition discussed Continue metformin 500 mg twice a day.  No longer having diarrhea issues Recommend to increase Mounjaro to 7.5 mg weekly Advised to decrease amount of daily carbohydrate intake and daily calories and increase amount of plant-based protein in his diet

## 2024-01-25 NOTE — Assessment & Plan Note (Signed)
Hemoglobin A1c at 7.2, still not at goal Higher than before Recommend to increase Mounjaro to 7.5 mg weekly Continue metformin 500 mg twice a day and glipizide 5 mg twice a day Hypoglycemia precautions given Cardiovascular risks associated with uncontrolled diabetes discussed Diet and nutrition discussed Blood work done today Follow-up in 6 months

## 2024-03-29 ENCOUNTER — Other Ambulatory Visit: Payer: Self-pay | Admitting: Emergency Medicine

## 2024-03-29 DIAGNOSIS — E1165 Type 2 diabetes mellitus with hyperglycemia: Secondary | ICD-10-CM

## 2024-03-29 DIAGNOSIS — F32A Depression, unspecified: Secondary | ICD-10-CM

## 2024-05-03 ENCOUNTER — Other Ambulatory Visit: Payer: Self-pay | Admitting: Emergency Medicine

## 2024-05-03 DIAGNOSIS — E1165 Type 2 diabetes mellitus with hyperglycemia: Secondary | ICD-10-CM

## 2024-07-02 ENCOUNTER — Other Ambulatory Visit: Payer: Self-pay | Admitting: Emergency Medicine

## 2024-07-02 DIAGNOSIS — E1165 Type 2 diabetes mellitus with hyperglycemia: Secondary | ICD-10-CM

## 2024-09-26 ENCOUNTER — Other Ambulatory Visit: Payer: Self-pay | Admitting: Emergency Medicine

## 2024-09-26 ENCOUNTER — Ambulatory Visit (INDEPENDENT_AMBULATORY_CARE_PROVIDER_SITE_OTHER): Admitting: Emergency Medicine

## 2024-09-26 ENCOUNTER — Encounter: Payer: Self-pay | Admitting: Emergency Medicine

## 2024-09-26 VITALS — BP 106/76 | HR 73 | Temp 98.3°F | Ht 72.0 in | Wt 266.0 lb

## 2024-09-26 DIAGNOSIS — F3341 Major depressive disorder, recurrent, in partial remission: Secondary | ICD-10-CM

## 2024-09-26 DIAGNOSIS — Z6841 Body Mass Index (BMI) 40.0 and over, adult: Secondary | ICD-10-CM | POA: Diagnosis not present

## 2024-09-26 DIAGNOSIS — E66813 Obesity, class 3: Secondary | ICD-10-CM

## 2024-09-26 DIAGNOSIS — E1165 Type 2 diabetes mellitus with hyperglycemia: Secondary | ICD-10-CM | POA: Diagnosis not present

## 2024-09-26 DIAGNOSIS — Z7984 Long term (current) use of oral hypoglycemic drugs: Secondary | ICD-10-CM

## 2024-09-26 LAB — MICROALBUMIN / CREATININE URINE RATIO
Creatinine,U: 84.2 mg/dL
Microalb Creat Ratio: 23 mg/g (ref 0.0–30.0)
Microalb, Ur: 1.9 mg/dL (ref 0.0–1.9)

## 2024-09-26 LAB — POCT GLYCOSYLATED HEMOGLOBIN (HGB A1C): Hemoglobin A1C: 9.1 % — AB (ref 4.0–5.6)

## 2024-09-26 MED ORDER — TIRZEPATIDE 5 MG/0.5ML ~~LOC~~ SOAJ: 5.0000 mg | SUBCUTANEOUS | 3 refills | Status: AC

## 2024-09-26 MED ORDER — FLUOXETINE HCL 20 MG PO TABS: 20.0000 mg | ORAL_TABLET | Freq: Every day | ORAL | 3 refills | Status: AC

## 2024-09-26 NOTE — Assessment & Plan Note (Signed)
 Hemoglobin A1c at 9.1, higher than before Recommend to restart Mounjaro  at 5 mg weekly Continue metformin  500 mg twice a day and glipizide  5 mg twice a day Hypoglycemia precautions given Cardiovascular risks associated with uncontrolled diabetes discussed Diet and nutrition discussed Blood work done today Follow-up in 6 months

## 2024-09-26 NOTE — Assessment & Plan Note (Signed)
 Wt Readings from Last 3 Encounters:  09/26/24 266 lb (120.7 kg)  01/25/24 271 lb (122.9 kg)  09/28/23 276 lb 2 oz (125.2 kg)  Diet and nutrition discussed Continue metformin  500 mg twice a day.  No longer having diarrhea issues Recommend to restart Mounjaro  5 mg weekly Advised to decrease amount of daily carbohydrate intake and daily calories and increase amount of plant-based protein in his diet

## 2024-09-26 NOTE — Patient Instructions (Signed)

## 2024-09-26 NOTE — Progress Notes (Signed)
 Ricky Whitney 48 y.o.   Chief Complaint  Patient presents with   Diabetes    Patient here to check his A1c, he states his sugar level is still high. Patient mentions that he stopped mounjaro  because he was not getting anymore refills. Patient also mentions if he can have the prozac  changed to tablets instead of gel capsule     HISTORY OF PRESENT ILLNESS: This is a 48 y.o. male here for follow-up of diabetes. Has been off Mounjaro  for 5 months Has no complaints or medical concerns today.  Lab Results  Component Value Date   HGBA1C 7.2 (A) 01/25/2024   Wt Readings from Last 3 Encounters:  09/26/24 266 lb (120.7 kg)  01/25/24 271 lb (122.9 kg)  09/28/23 276 lb 2 oz (125.2 kg)     Diabetes Pertinent negatives for hypoglycemia include no dizziness or headaches. Pertinent negatives for diabetes include no chest pain.     Prior to Admission medications   Medication Sig Start Date End Date Taking? Authorizing Provider  FLUoxetine  (PROZAC ) 20 MG tablet Take 1 tablet (20 mg total) by mouth daily. 01/25/24  Yes Lenox Ladouceur, Emil Schanz, MD  glipiZIDE  (GLUCOTROL ) 5 MG tablet TAKE 1 TABLET (5 MG TOTAL) BY MOUTH TWICE A DAY BEFORE MEALS 03/29/24  Yes Jamaia Brum, Emil Schanz, MD  glucose monitoring kit (FREESTYLE) monitoring kit 1 each by Does not apply route as needed for other. Dispense any model that is covered- dispense testing supplies for Q TID AC/ HS accuchecks- 1 month supply with one refil. 09/11/19  Yes Rizwan, Saima, MD  Lancets (ONETOUCH DELICA PLUS LANCET30G) MISC CHECK BLOOD SUGAR BEFORE MEALS AND AT BEDTIME 10/05/19  Yes [provider]  metFORMIN  (GLUCOPHAGE ) 500 MG tablet TAKE 1 TABLET BY MOUTH 2 TIMES DAILY WITH A MEAL. 03/29/24  Yes Charlis Harner, Emil Schanz, MD  [facility] test strip TEST BLOOD SUGAR BEFORE MEALS AND AT BEDTIME 10/07/19  Yes [provider]  rosuvastatin  (CRESTOR ) 10 MG tablet TAKE 1 TABLET BY MOUTH EVERY DAY 07/03/24  Yes Kamalei Roeder, Emil Schanz, MD   FLUoxetine  (PROZAC ) 20 MG capsule TAKE 1 CAPSULE BY MOUTH EVERY DAY Patient not taking: Reported on 09/26/2024 03/29/24   Darcell Yacoub Jose, MD  tirzepatide  (MOUNJARO ) 7.5 MG/0.5ML Pen Inject 7.5 mg into the skin once a week. 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   Depression 07/17/2013    Past Medical History:  Diagnosis Date   Anxiety    Depression    Diabetes mellitus without complication (HCC)    Vaping-related disorder     Past Surgical History:  Procedure Laterality Date   ORIF ORBITAL FRACTURE Right 09/21/2019   Procedure: OPEN REDUCTION INTERNAL FIXATION (ORIF) RIGHT ZYGOMATICOMAXILLARY FRACTURE;  Surgeon: Joanette Soulier, DMD;  Location: [facility];  Service: Oral Surgery;  Laterality: Right;   wisdon teeth       Social History   Socioeconomic History   Marital status: Married    Spouse name: Not on file   Number of children: Not on file   Years of education: Not on file  Highest education level: Not on file  Occupational History   Not on file  Tobacco Use   Smoking status: Never   Smokeless tobacco: Never  Vaping Use   Vaping status: Every Day  Substance and Sexual Activity   Alcohol use: Not Currently    Comment: occassional   Drug use: Yes    Comment: vaping   Sexual activity: Not Currently  Other Topics Concern   Not on file  Social History Narrative   ** Merged History Encounter **       Social Drivers of Health   Financial Resource Strain: Not on file  Food Insecurity: Not on file  Transportation Needs: Not on  file  Physical Activity: Not on file  Stress: Not on file  Social Connections: Unknown (04/25/2022)   Received from [facility]   Social Network    Social Network: Not on file  Intimate Partner Violence: Unknown (03/24/2022)   Received from [facility]   HITS    Physically Hurt: Not on file    Insult or Talk Down To: Not on file    Threaten Physical Harm: Not on file    Scream or Curse: Not on file    Family History  Problem Relation Age of Onset   Diabetes Mellitus II Mother    Hypertension Mother    Heart attack Mother    Diabetes Mellitus II Father    Hypertension Father    Diabetes Mellitus II Brother    Hypertension Brother      Review of Systems  Constitutional: Negative.  Negative for chills and fever.  HENT: Negative.  Negative for congestion and sore throat.   Respiratory: Negative.  Negative for cough and shortness of breath.   Cardiovascular: Negative.  Negative for chest pain and palpitations.  Gastrointestinal:  Negative for abdominal pain, diarrhea, nausea and vomiting.  Genitourinary: Negative.  Negative for dysuria.  Musculoskeletal: Negative.   Skin: Negative.  Negative for rash.  Neurological: Negative.  Negative for dizziness and headaches.  All other systems reviewed and are negative.   Vitals:   09/26/24 0905  BP: 106/76  Pulse: 73  Temp: 98.3 F (36.8 C)  SpO2: 95%    Physical Exam Vitals reviewed.  Constitutional:      Appearance: Normal appearance. He is obese.  HENT:     Head: Normocephalic.     Mouth/Throat:     Mouth: Mucous membranes are moist.     Pharynx: Oropharynx is clear.  Eyes:     Extraocular Movements: Extraocular movements intact.     Pupils: Pupils are equal, round, and reactive to light.  Cardiovascular:     Rate and Rhythm: Normal rate and regular rhythm.     Pulses: Normal pulses.     Heart sounds: Normal heart sounds.  Pulmonary:     Effort: Pulmonary effort is normal.     Breath sounds: Normal breath  sounds.  Abdominal:     Palpations: Abdomen is soft.     Tenderness: There is no abdominal tenderness.  Musculoskeletal:     Cervical back: No tenderness.  Lymphadenopathy:     Cervical: No cervical adenopathy.  Skin:    General: Skin is warm and dry.     Capillary Refill: Capillary refill takes less than 2 seconds.  Neurological:     General: No focal deficit present.     Mental Status: He is alert and oriented to person, place, and time.  Psychiatric:  Mood and Affect: Mood normal.        Behavior: Behavior normal.    Results for orders placed or performed in visit on 09/26/24 (from the past 24 hours)  POCT HgB A1C     Status: Abnormal   Collection Time: 09/26/24  9:22 AM  Result Value Ref Range   Hemoglobin A1C 9.1 (A) 4.0 - 5.6 %   HbA1c POC (<> result, manual entry)     HbA1c, POC (prediabetic range)     HbA1c, POC (controlled diabetic range)       ASSESSMENT & PLAN: A total of 40 minutes was spent with the patient and counseling/coordination of care regarding preparing for this visit, review of most recent office visit notes, review of multiple chronic medical conditions and their management, cardiovascular risks associated with uncontrolled diabetes review of all medications and changes made, review of most recent bloodwork results including interpretation of today's hemoglobin A1c, review of health maintenance items, education on nutrition, prognosis, documentation, and need for follow up.   Problem List Items Addressed This Visit       Endocrine   Diabetes mellitus with hyperglycemia (HCC) - Primary   Hemoglobin A1c at 9.1, higher than before Recommend to restart Mounjaro  at 5 mg weekly Continue metformin  500 mg twice a day and glipizide  5 mg twice a day Hypoglycemia precautions given Cardiovascular risks associated with uncontrolled diabetes discussed Diet and nutrition discussed Blood work done today Follow-up in 6 months      Relevant Medications    tirzepatide  (MOUNJARO ) 5 MG/0.5ML Pen   Other Relevant Orders   POCT HgB A1C (Completed)   Microalbumin / creatinine urine ratio   Comprehensive metabolic panel with GFR   CBC with Differential/Platelet   Lipid panel     Other   Recurrent major depressive disorder, in partial remission   Stable and well-controlled Continue Prozac  20 mg daily      Relevant Medications   FLUoxetine  (PROZAC ) 20 MG tablet   Class 3 severe obesity due to excess calories without serious comorbidity with body mass index (BMI) of 40.0 to 44.9 in adult (HCC)   Wt Readings from Last 3 Encounters:  09/26/24 266 lb (120.7 kg)  01/25/24 271 lb (122.9 kg)  09/28/23 276 lb 2 oz (125.2 kg)  Diet and nutrition discussed Continue metformin  500 mg twice a day.  No longer having diarrhea issues Recommend to restart Mounjaro  5 mg weekly Advised to decrease amount of daily carbohydrate intake and daily calories and increase amount of plant-based protein in his diet       Relevant Medications   tirzepatide  (MOUNJARO ) 5 MG/0.5ML Pen   Patient Instructions  Diabetes: Carbohydrate Counting for Adults Carbohydrate counting is a method of keeping track of how many carbohydrates you eat. Eating carbohydrates increases the amount of sugar, also called glucose, in your blood. By counting how many carbohydrates you eat, you can improve how well you manage your blood sugar. This, in turn, helps you manage your diabetes. Carbohydrates are measured in grams (g) per serving. It's important to know how many carbohydrates (in grams or by serving size) you can have in each meal. This is different for every person. A dietitian can help you make a meal plan and calculate how many carbohydrates you should have at each meal and snack. What foods contain carbohydrates? Carbohydrates are found in these foods: Grains, such as breads and cereals. Dried beans and soy products. Starchy vegetables, such as potatoes, peas, and corn. Fruit and  fruit juices. Milk and yogurt. Sweets and snack foods like cake, cookies, candy, chips, and soft drinks. How do I count carbohydrates in foods? There are two ways to count carbohydrates in food. You can read food labels or learn standard serving sizes of foods. You can use either of these methods or a combination of both. Using the Nutrition Facts label The Nutrition Facts list is included on the labels of almost all packaged foods and drinks in the [place] It includes: The serving size. Information about nutrients in each serving. This includes the grams of carbohydrate per serving. To use the Nutrition Facts, decide how many servings you will have. Then, multiply the number of servings by the number of carbohydrates per serving. The resulting number is the total grams of carbohydrates that you'll be having. Learning the standard serving sizes of foods When you eat carbohydrate foods that aren't packaged or don't include Nutrition Facts on the label, you need to measure the servings in order to count the grams of carbohydrates. Measure the foods that you'll eat with a food scale or measuring cup, if needed. Decide how many standard-size servings you'll eat. Multiply the number of servings by 15. For foods that contain carbohydrates, one serving equals 15 g of carbohydrates. For example, if you eat 2 cups or 10 oz (300 g) of strawberries, you'll have eaten 2 servings and 30 g of carbohydrates (2 servings x 15 g = 30 g). For foods that have more than one food mixed, such as soups and casseroles, you must count the carbohydrates in each food that's included. Here's a list of standard serving sizes for common carbohydrate-rich foods. Each of these servings has about 15 g of carbohydrates: 1 slice of bread. 1 six-inch (15 cm) tortilla. ? cup or 2 oz (53 g) of cooked rice or pasta.  cup or 3 oz (85 g) of cooked or canned, drained, and rinsed beans or lentils.  cup or 3 oz (85 g) of a starchy  vegetable, such as peas, corn, or squash.  cup or 4 oz (120 g) of hot cereal.  cup or 3 oz (85 g) of boiled or mashed potatoes, or  or 3 oz (85 g) of a large baked potato.  cup or 4 fl oz (118 mL) of fruit juice. 1 cup or 8 fl oz (237 mL) of milk. 1 small or 4 oz (106 g) apple.  or 2 oz (63 g) of a medium banana. 1 cup or 5 oz (150 g) of strawberries. 3 cups or 1 oz (28.3 g) of popped popcorn. What is an example of carbohydrate counting? To calculate the grams of carbohydrates in this sample meal, follow the steps below. Sample meal 3 oz (85 g) chicken breast. ? cup or 4 oz (106 g) of brown rice.  cup or 3 oz (85 g) of corn. 1 cup or 8 fl oz (237 mL) of milk. 1 cup or 5 oz (150 g) of strawberries with sugar-free whipped topping. Carbohydrate calculation Identify the foods that have carbohydrates: Rice. Corn. Milk. Strawberries. Calculate how many servings you have of each food: 2 servings of rice. 1 serving of corn. 1 serving of milk. 1 serving of strawberries. Multiply each number of servings by 15 g: 2 servings of rice x 15 g = 30 g. 1 serving of corn x 15 g = 15 g. 1 serving of milk x 15 g = 15 g. 1 serving of strawberries x 15 g = 15 g. Add together all  of the amounts to find the total grams of carbohydrates eaten: 30 g + 15 g + 15 g + 15 g = 75 g of carbohydrates total. Where to find more information To learn more, go to: American Diabetes Association at diabetes.org. Click Search and type carb counting. Find the link you need. Centers for Disease Control and Prevention at TonerPromos.no. Click Search and type diabetes. Find the link you need. Academy of Nutrition and Dietetics: eatright.org This information is not intended to replace advice given to you by your health care provider. Make sure you discuss any questions you have with your health care provider. Document Revised: 11/25/2023 Document Reviewed: 11/25/2023 Elsevier Patient Education  2025 Elsevier  Inc.     Emil Schaumann, MD [facility]

## 2024-09-26 NOTE — Assessment & Plan Note (Signed)
Stable and well-controlled Continue Prozac 20 mg daily

## 2024-09-26 NOTE — Telephone Encounter (Signed)
 He wants tablets, no capsules.  Please inform patient of pharmacy comments.

## 2024-10-27 NOTE — Telephone Encounter (Signed)
 Called patient and LVM in regards to this

## 2025-01-09 LAB — OPHTHALMOLOGY REPORT-SCANNED
